# Patient Record
Sex: Male | Born: 1985 | Race: Black or African American | Hispanic: No | Smoking: Former smoker
Health system: Southern US, Community
[De-identification: ages and names within clinical notes are randomized; demographics above are authoritative.]

## PROBLEM LIST (undated history)

## (undated) DIAGNOSIS — J45909 Unspecified asthma, uncomplicated: Secondary | ICD-10-CM

---
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
## 2007-03-26 ENCOUNTER — Emergency Department (HOSPITAL_COMMUNITY): Admission: EM | Admit: 2007-03-26 | Discharge: 2007-03-26 | Payer: Self-pay | Admitting: Family Medicine

## 2008-07-21 IMAGING — CR DG ABDOMEN ACUTE W/ 1V CHEST
3 series · 3 of 3 positions shown · non-contrast
Comparison: none

CLINICAL DATA: Abdominal pain

[w chest pa]
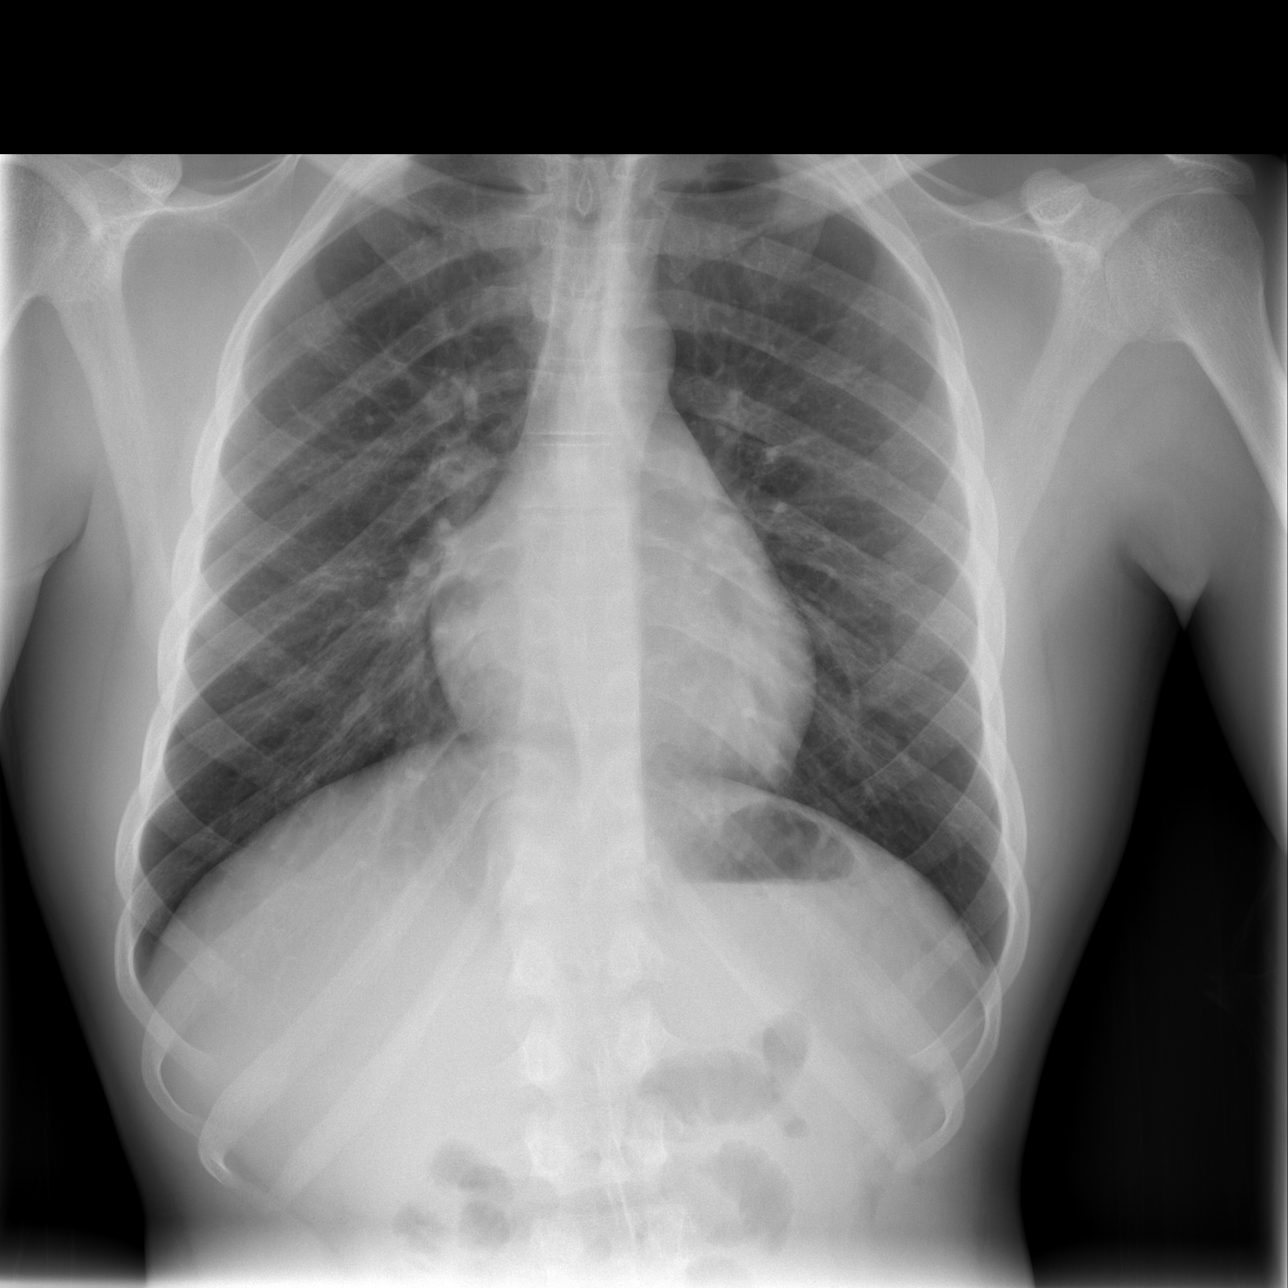

[w abdomen upright]
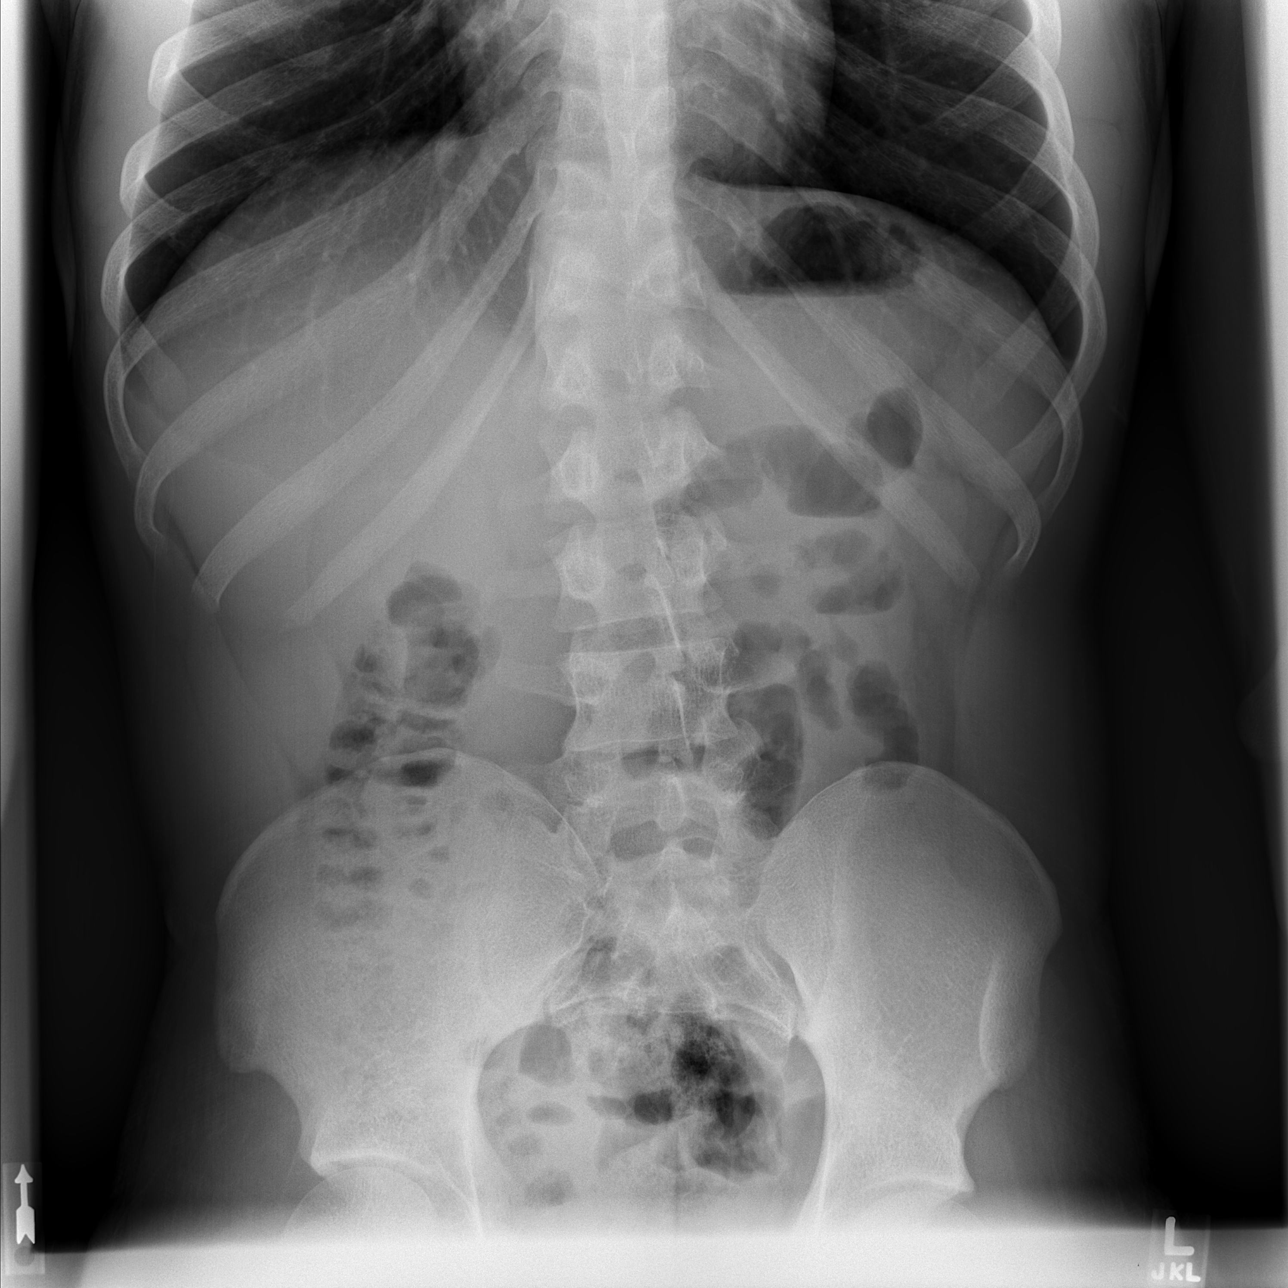

[t abdomen supine]
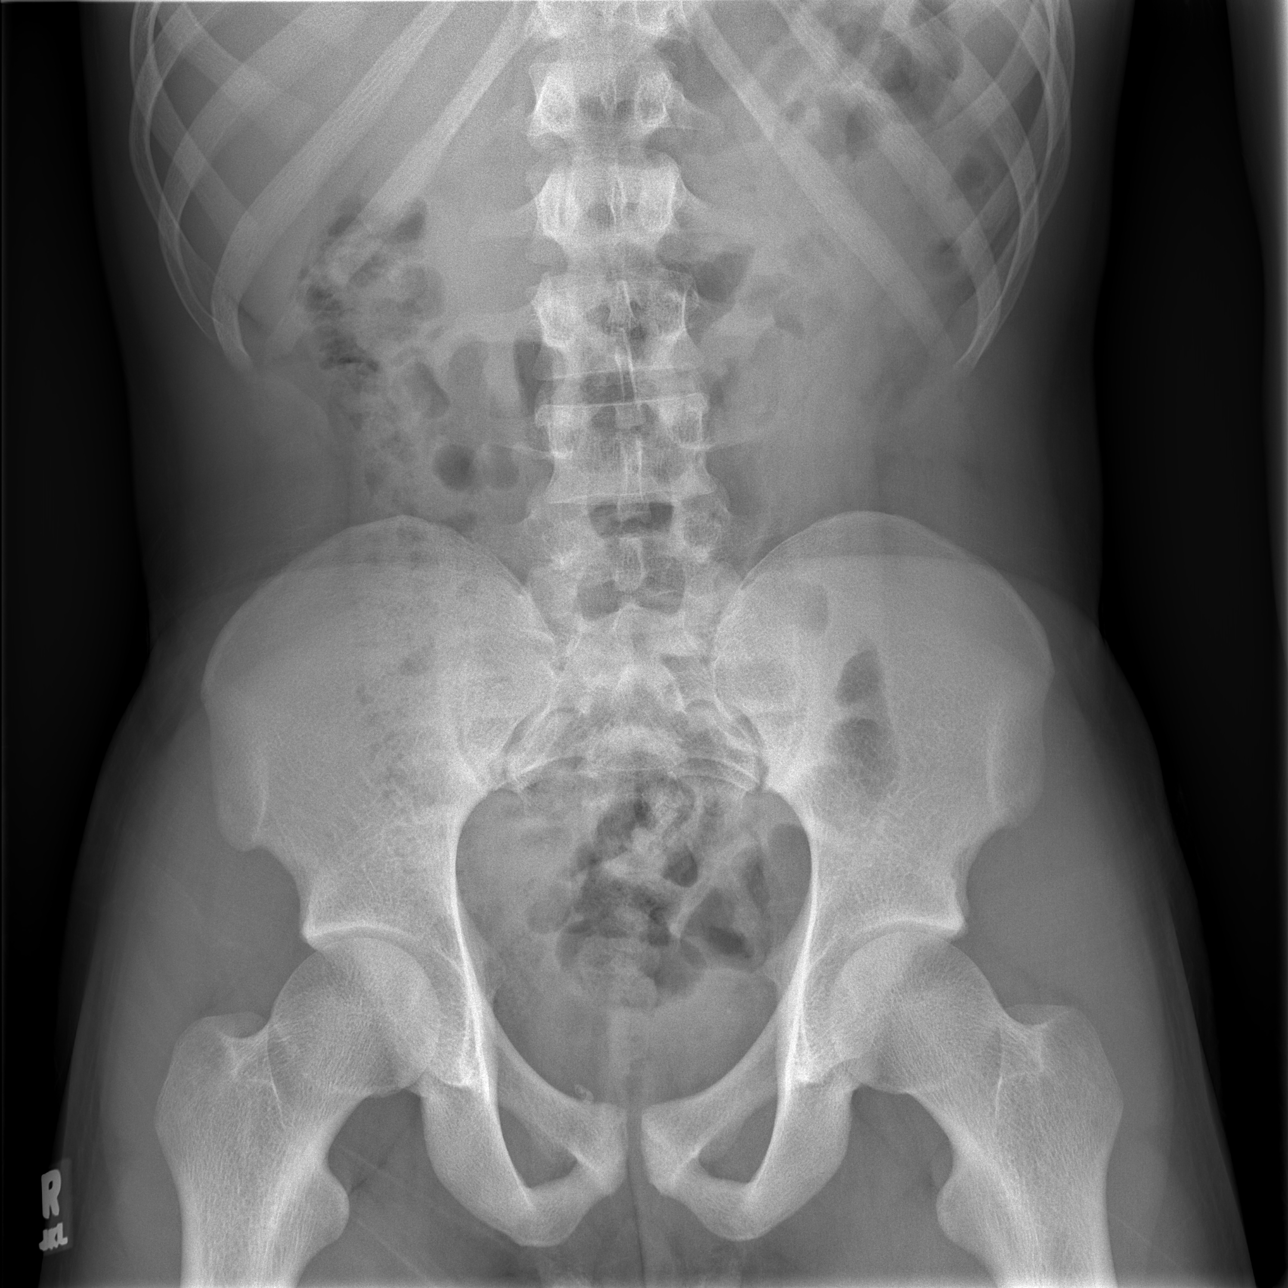

[3 of 3 positions shown; findings below may reference images not displayed]

Acute abdomen with chest:

No previous for comparison. The frontal chest film shows clear lungs. Heart size
and pulmonary vascularity normal.
Supine erect abdomen films show no free air. Small bowel decompressed. Normal
distribution of gas and stool throughout the colon. No abnormal abdominal
calcifications. Visualized bones unremarkable.
IMPRESSION: 1. Normal bowel gas pattern. No free air.
2. No acute cardiopulmonary disease.

## 2010-04-29 ENCOUNTER — Emergency Department (HOSPITAL_COMMUNITY): Admission: EM | Admit: 2010-04-29 | Discharge: 2010-04-29 | Payer: Self-pay | Admitting: Family Medicine

## 2010-05-29 ENCOUNTER — Emergency Department (HOSPITAL_COMMUNITY)
Admission: EM | Admit: 2010-05-29 | Discharge: 2010-05-29 | Payer: Self-pay | Source: Home / Self Care | Admitting: Emergency Medicine

## 2010-06-06 ENCOUNTER — Emergency Department (HOSPITAL_COMMUNITY)
Admission: EM | Admit: 2010-06-06 | Discharge: 2010-06-06 | Payer: Self-pay | Source: Home / Self Care | Admitting: Emergency Medicine

## 2010-08-15 ENCOUNTER — Emergency Department (HOSPITAL_COMMUNITY)
Admission: EM | Admit: 2010-08-15 | Discharge: 2010-08-15 | Disposition: A | Discharge status: Home / Self Care | Payer: Medicaid Other | Attending: Emergency Medicine | Admitting: Emergency Medicine

## 2010-08-15 DIAGNOSIS — K089 Disorder of teeth and supporting structures, unspecified: Secondary | ICD-10-CM | POA: Insufficient documentation

## 2010-08-15 DIAGNOSIS — K029 Dental caries, unspecified: Secondary | ICD-10-CM | POA: Insufficient documentation

## 2010-09-01 ENCOUNTER — Emergency Department (HOSPITAL_COMMUNITY)
Admission: EM | Admit: 2010-09-01 | Discharge: 2010-09-01 | Disposition: A | Discharge status: Home / Self Care | Payer: Medicaid Other | Attending: Emergency Medicine | Admitting: Emergency Medicine

## 2010-09-01 DIAGNOSIS — J351 Hypertrophy of tonsils: Secondary | ICD-10-CM | POA: Insufficient documentation

## 2010-09-01 DIAGNOSIS — J029 Acute pharyngitis, unspecified: Secondary | ICD-10-CM | POA: Insufficient documentation

## 2010-09-05 LAB — POCT RAPID STREP A (OFFICE): Streptococcus, Group A Screen (Direct): NEGATIVE

## 2010-09-05 LAB — POCT INFECTIOUS MONO SCREEN: Mono Screen: NEGATIVE

## 2011-04-10 LAB — URINALYSIS, ROUTINE W REFLEX MICROSCOPIC
Glucose, UA: NEGATIVE
Nitrite: NEGATIVE
Specific Gravity, Urine: 1.016
pH: 7

## 2011-04-10 LAB — DIFFERENTIAL
Basophils Absolute: 0
Lymphocytes Relative: 33
Lymphs Abs: 2
Neutro Abs: 3.4

## 2011-04-10 LAB — CBC
HCT: 47.7
Platelets: 206
RDW: 12.9
WBC: 6.2

## 2011-10-12 ENCOUNTER — Encounter (HOSPITAL_COMMUNITY): Payer: Self-pay

## 2011-10-12 ENCOUNTER — Emergency Department (HOSPITAL_COMMUNITY)
Admission: EM | Admit: 2011-10-12 | Discharge: 2011-10-12 | Disposition: A | Discharge status: Home / Self Care | Payer: Medicaid Other | Source: Home / Self Care

## 2011-10-12 DIAGNOSIS — H1010 Acute atopic conjunctivitis, unspecified eye: Secondary | ICD-10-CM

## 2011-10-12 DIAGNOSIS — J309 Allergic rhinitis, unspecified: Secondary | ICD-10-CM

## 2011-10-12 MED ORDER — KETOTIFEN FUMARATE 0.025 % OP SOLN: 1.0000 [drp] | Freq: Two times a day (BID) | OPHTHALMIC | Status: AC

## 2011-10-12 MED ORDER — POLYMYXIN B-TRIMETHOPRIM 10000-0.1 UNIT/ML-% OP SOLN: 1.0000 [drp] | OPHTHALMIC | Status: AC

## 2011-10-12 MED ORDER — CETIRIZINE HCL 10 MG PO TABS: 10.0000 mg | ORAL_TABLET | Freq: Every day | ORAL | Status: DC

## 2011-10-12 NOTE — Discharge Instructions (Signed)
Thank you for coming in today. I think you have allergies in your eye. Use the Zaditor eye drops up to twice a day as needed for itchy red eyes. Take Zyrtec daily. If you are  still feeling bad after one week start taking the Polytrim eyedrops as they are antibiotics.  Allergic Conjunctivitis The conjunctiva is a thin membrane that covers the visible white part of the eyeball and the underside of the eyelids. This membrane protects and lubricates the eye. The membrane has small blood vessels running through it that can normally be seen. When the conjunctiva becomes inflamed, the condition is called conjunctivitis. In response to the inflammation, the conjunctival blood vessels become swollen. The swelling results in redness in the normally white part of the eye. The blood vessels of this membrane also react when a person has allergies and is then called allergic conjunctivitis. This condition usually lasts for as long as the allergy persists. Allergic conjunctivitis cannot be passed to another person (non-contagious). The likelihood of bacterial infection is great and the cause is not likely due to allergies if the inflamed eye has:  A sticky discharge.   Discharge or sticking together of the lids in the morning.   Scaling or flaking of the eyelids where the eyelashes come out.   Red swollen eyelids.  CAUSES   Viruses.   Irritants such as foreign bodies.   Chemicals.   General allergic reactions.   Inflammation or serious diseases in the inside or the outside of the eye or the orbit (the boney cavity in which the eye sits) can cause a "red eye."  SYMPTOMS   Eye redness.   Tearing.   Itchy eyes.   Burning feeling in the eyes.   Clear drainage from the eye.   Allergic reaction due to pollens or ragweed sensitivity. Seasonal allergic conjunctivitis is frequent in the spring when pollens are in the air and in the fall.  DIAGNOSIS  This condition, in its many forms, is usually  diagnosed based on the history and an ophthalmological exam. It usually involves both eyes. If your eyes react at the same time every year, allergies may be the cause. While most "red eyes" are due to allergy or an infection, the role of an eye (ophthalmological) exam is important. The exam can rule out serious diseases of the eye or orbit. TREATMENT   Non-antibiotic eye drops, ointments, or medications by mouth may be prescribed if the ophthalmologist is sure the conjunctivitis is due to allergies alone.   Over-the-counter drops and ointments for allergic symptoms should be used only after other causes of conjunctivitis have been ruled out, or as your caregiver suggests.  Medications by mouth are often prescribed if other allergy-related symptoms are present. If the ophthalmologist is sure that the conjunctivitis is due to allergies alone, treatment is normally limited to drops or ointments to reduce itching and burning. HOME CARE INSTRUCTIONS   Wash hands before and after applying drops or ointments, or touching the inflamed eye(s) or eyelids.   Do not let the eye dropper tip or ointment tube touch the eyelid when putting medicine in your eye.   Stop using your soft contact lenses and throw them away. Use a new pair of lenses when recovery is complete. You should run through sterilizing cycles at least three times before use after complete recovery if the old soft contact lenses are to be used. Hard contact lenses should be stopped. They need to be thoroughly sterilized before use after  recovery.   Itching and burning eyes due to allergies is often relieved by using a cool cloth applied to closed eye(s).  SEEK MEDICAL CARE IF:   Your problems do not go away after two or three days of treatment.   Your lids are sticky (especially in the morning when you wake up) or stick together.   Discharge develops. Antibiotics may be needed either as drops, ointment, or by mouth.   You have extreme light  sensitivity.   An oral temperature above 102 F (38.9 C) develops.   Pain in or around the eye or any other visual symptom develops.  MAKE SURE YOU:   Understand these instructions.   Will watch your condition.   Will get help right away if you are not doing well or get worse.  Document Released: 09/01/2002 Document Revised: 05/31/2011 Document Reviewed: 07/28/2007 [facility] Patient Information 2012 [place], [place].

## 2011-10-12 NOTE — ED Provider Notes (Signed)
Charles Rollins is a 26 y.o. male who presents to Urgent Care today for bilateral conjunctivitis left worse than right for one week. Patient notes red itchy watery eyes associated with runny nose and sneezing. He denies any sick contacts and feels well otherwise. He has not tried any allergy medicine. He denies any trouble breathing. No eye pain or trouble seeing.   PMH reviewed. Otherwise healthy ROS as above otherwise neg.  no chest pains, palpitations, fevers, chills, abdominal pain nausea or vomiting. Medications reviewed. No current facility-administered medications for this encounter.   Current Outpatient Prescriptions  Medication Sig Dispense Refill  . cetirizine (ZYRTEC ALLERGY) 10 MG tablet Take 1 tablet (10 mg total) by mouth daily.  30 tablet  12  . ketotifen (ZADITOR) 0.025 % ophthalmic solution Place 1 drop into both eyes 2 (two) times daily.  5 mL  0  . trimethoprim-polymyxin b (POLYTRIM) ophthalmic solution Place 1 drop into both eyes every 4 (four) hours.  10 mL  0    Exam:  BP 125/78  Pulse 70  Temp(Src) 97.8 F (36.6 C) (Oral)  Resp 16  SpO2 100% Gen: Well NAD HEENT: EOMI,  MMM bilateral conjunctival injection without discharge. Lungs: CTABL Nl WOB   No results found for this or any previous visit (from the past 24 hour(s)). No results found.  Assessment and Plan: 26 y.o. male with likely allergic conjunctivitis.  Plan treat symptomatically with Zyrtec and Zaditor eyedrops.  Additionally we'll reserve Polytrim eyedrops for nonimprovement after one week. Provided a handout. Discussed warning signs of patient. He expresses understanding.     Rodolph Bong, MD 10/12/11 [phone]

## 2011-10-12 NOTE — ED Notes (Signed)
C/o eye reddened x 1 week, URI type syx

## 2011-10-13 NOTE — ED Provider Notes (Signed)
Medical screening examination/treatment/procedure(s) were performed by PGY-3 FM resident and as supervising physician I was immediately available for consultation/collaboration.   Haitham Dolinsky Moreno-Coll, MD   Lindberg Zenon Moreno-Coll, MD 10/13/11 0027 

## 2011-12-20 IMAGING — CR DG KNEE COMPLETE 4+V*L*
4 series · 4 of 4 positions shown · non-contrast
Comparison: None.

CLINICAL DATA: Fell - knee pain

LEFT KNEE - COMPLETE 4+ VIEW

[t knee ap left]
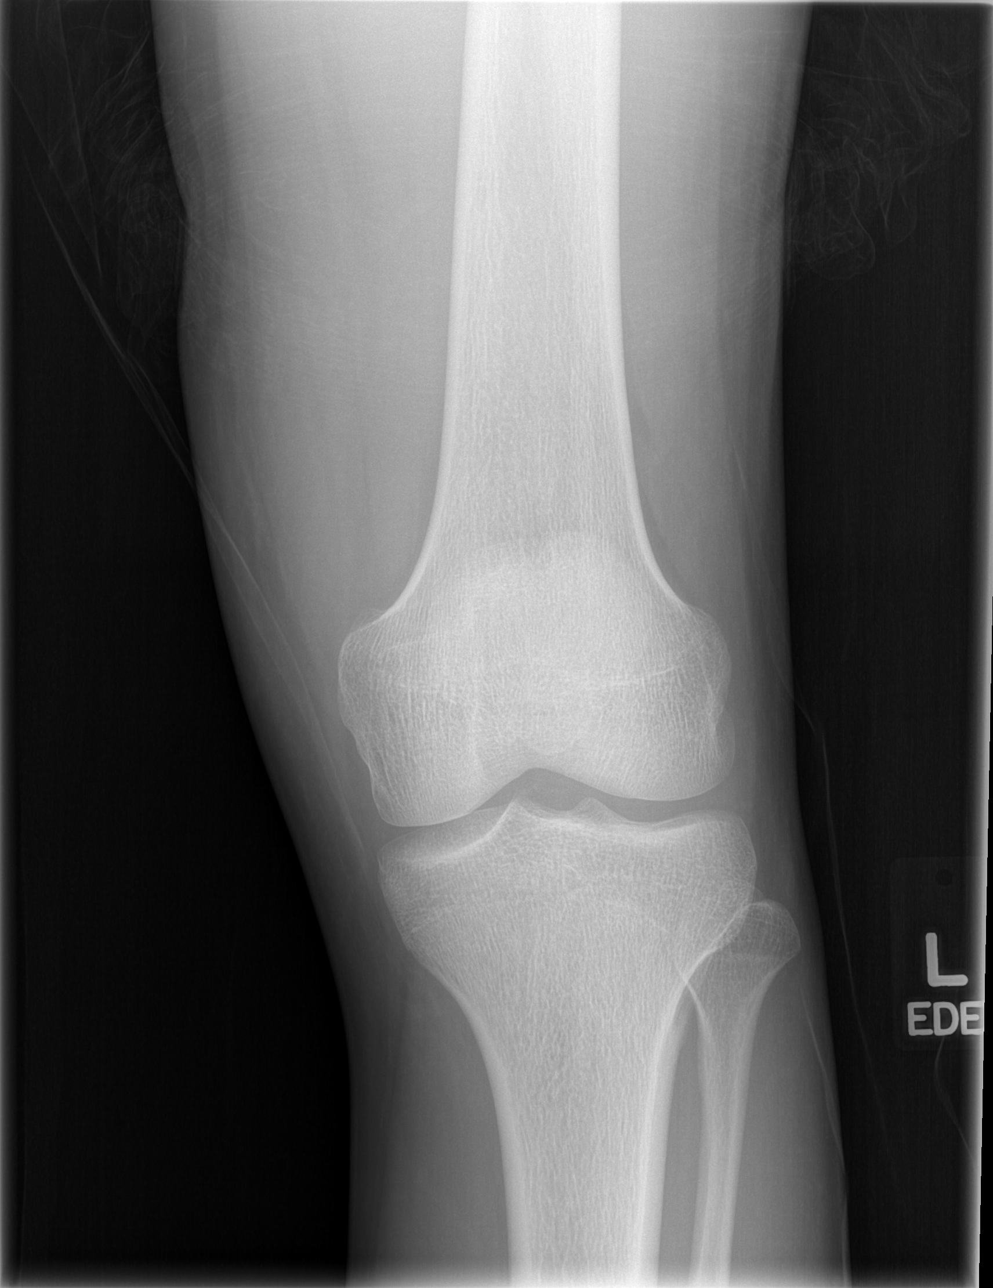

[t knee oblique left (1 of 2)]
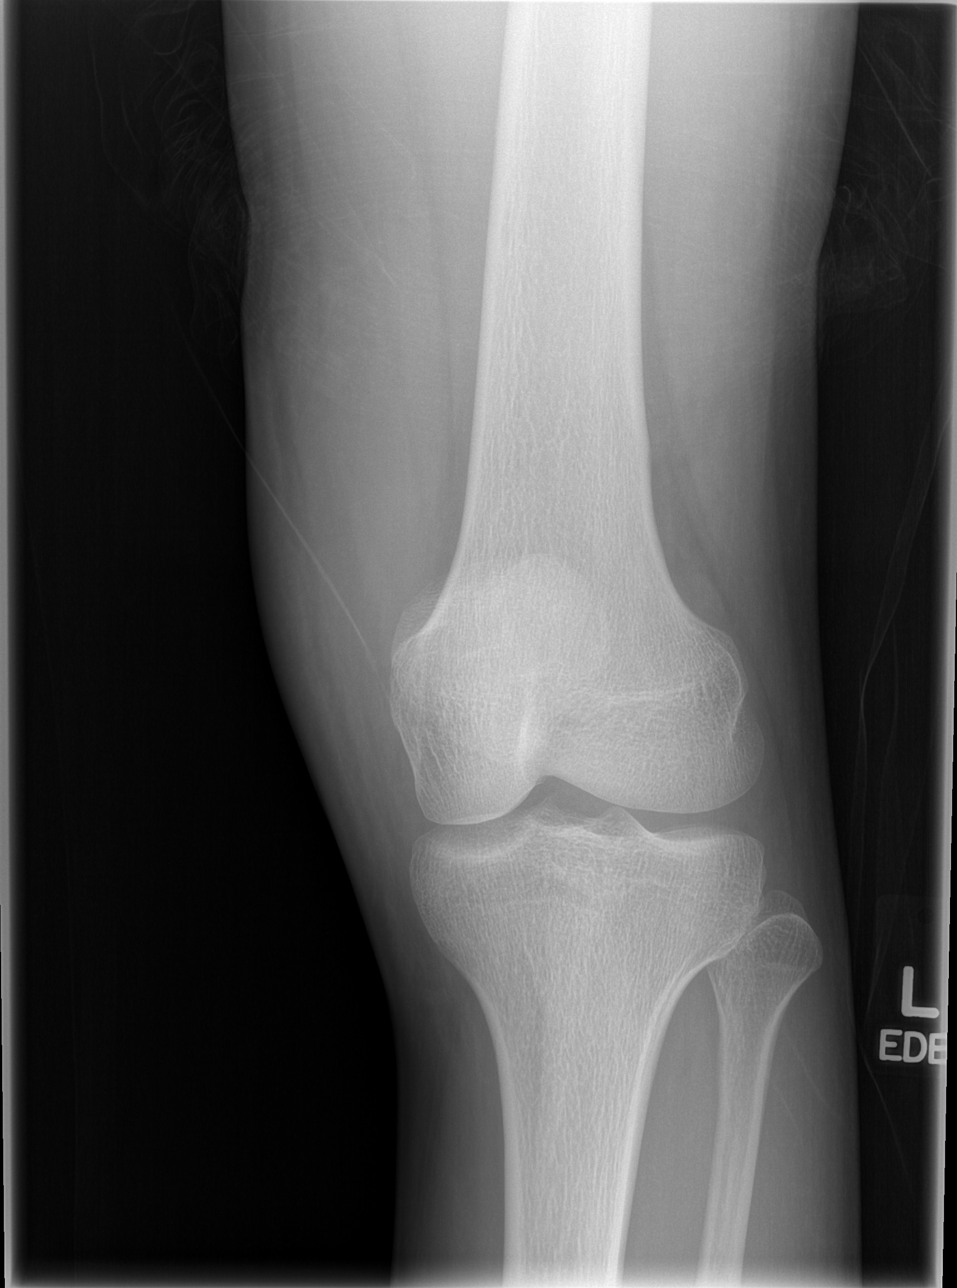

[t knee oblique left (2 of 2)]
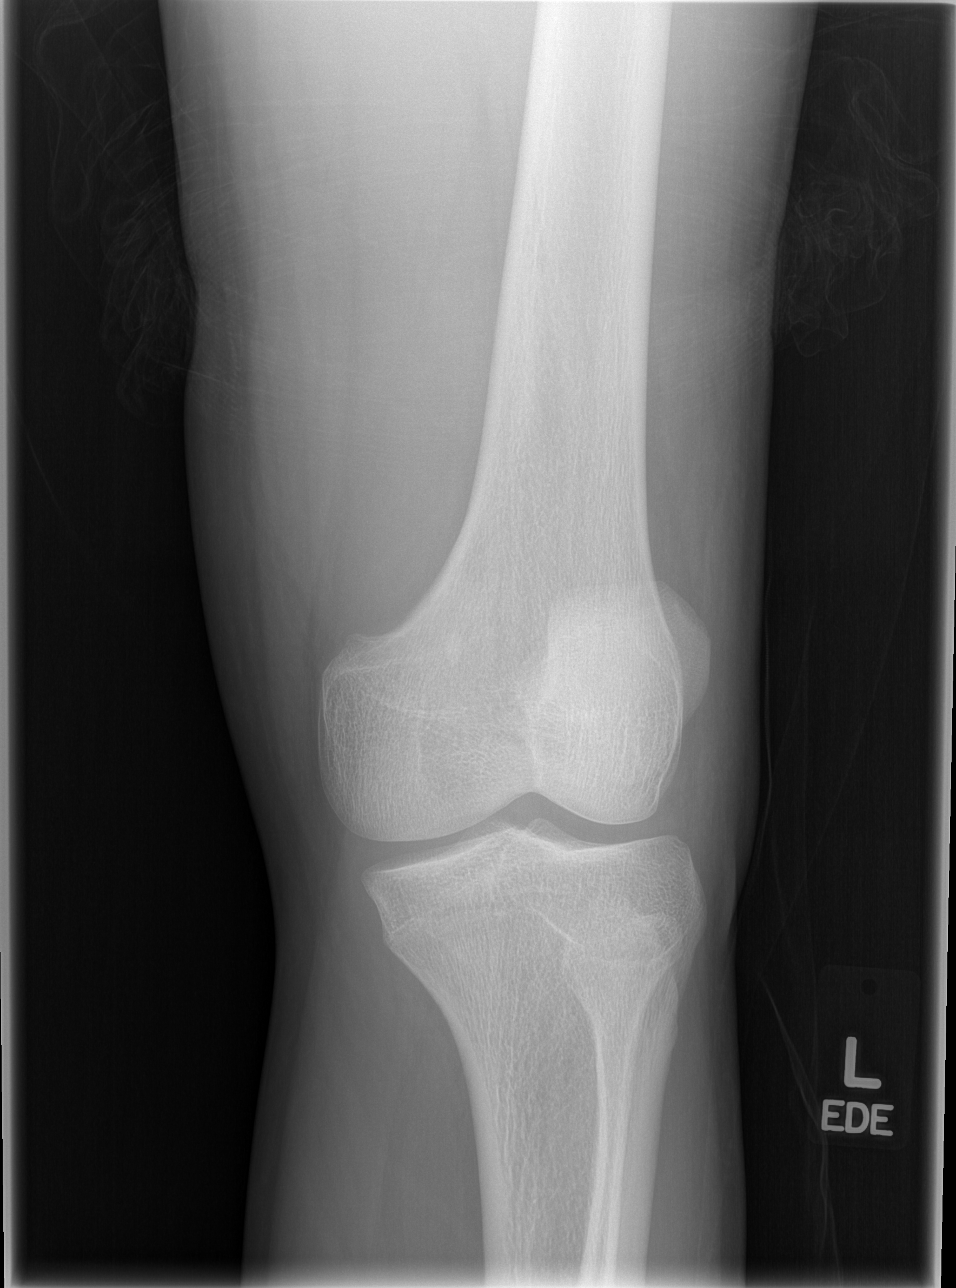

[t knee lat left]
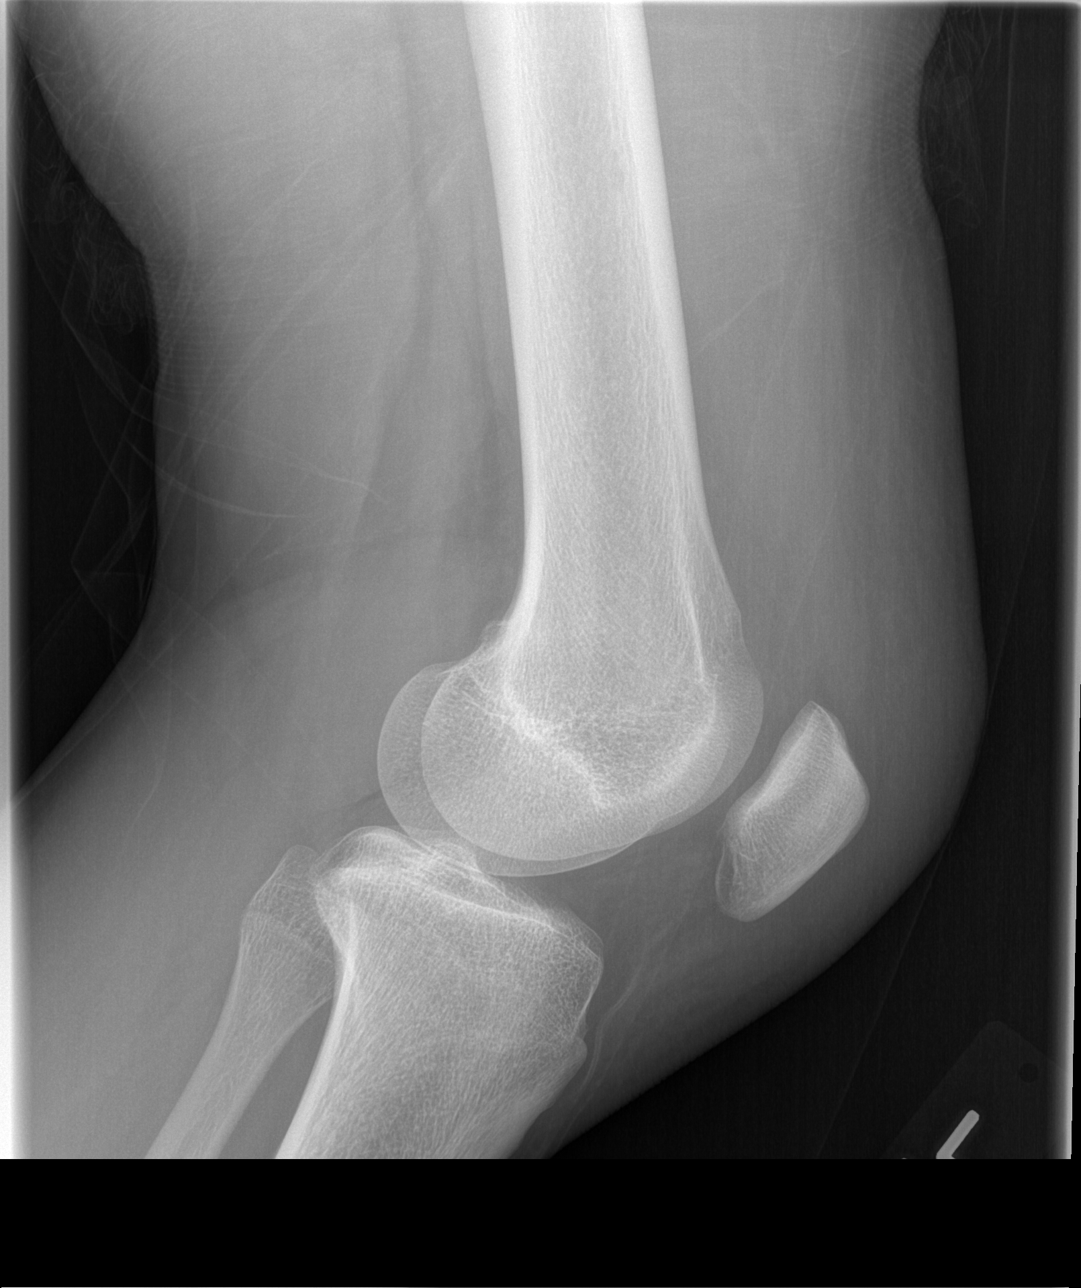

[4 of 4 positions shown; findings below may reference images not displayed]

FINDINGS: There is soft tissue swelling over the patella.  No joint
effusion or foreign body.  Bony structures intact.  The joint
normal.
IMPRESSION: Prepatellar soft tissue swelling - otherwise normal.

## 2013-11-10 ENCOUNTER — Emergency Department (HOSPITAL_COMMUNITY)
Admission: EM | Admit: 2013-11-10 | Discharge: 2013-11-10 | Disposition: A | Discharge status: Home / Self Care | Payer: Medicaid Other | Attending: Emergency Medicine | Admitting: Emergency Medicine

## 2013-11-10 ENCOUNTER — Encounter (HOSPITAL_COMMUNITY): Payer: Self-pay | Admitting: Emergency Medicine

## 2013-11-10 DIAGNOSIS — Z87891 Personal history of nicotine dependence: Secondary | ICD-10-CM | POA: Insufficient documentation

## 2013-11-10 DIAGNOSIS — K0889 Other specified disorders of teeth and supporting structures: Secondary | ICD-10-CM

## 2013-11-10 DIAGNOSIS — K089 Disorder of teeth and supporting structures, unspecified: Secondary | ICD-10-CM | POA: Insufficient documentation

## 2013-11-10 DIAGNOSIS — Z79899 Other long term (current) drug therapy: Secondary | ICD-10-CM | POA: Insufficient documentation

## 2013-11-10 MED ORDER — PENICILLIN V POTASSIUM 500 MG PO TABS: 500.0000 mg | ORAL_TABLET | Freq: Four times a day (QID) | ORAL | Status: AC

## 2013-11-10 MED ORDER — OXYCODONE-ACETAMINOPHEN 5-325 MG PO TABS: 1.0000 | ORAL_TABLET | ORAL | Status: DC | PRN

## 2013-11-10 NOTE — Discharge Instructions (Signed)
Dental Pain °A tooth ache may be caused by cavities (tooth decay). Cavities expose the nerve of the tooth to air and hot or cold temperatures. It may come from an infection or abscess (also called a boil or furuncle) around your tooth. It is also often caused by dental caries (tooth decay). This causes the pain you are having. °DIAGNOSIS  °Your caregiver can diagnose this problem by exam. °TREATMENT  °· If caused by an infection, it may be treated with medications which kill germs (antibiotics) and pain medications as prescribed by your caregiver. Take medications as directed. °· Only take over-the-counter or prescription medicines for pain, discomfort, or fever as directed by your caregiver. °· Whether the tooth ache today is caused by infection or dental disease, you should see your dentist as soon as possible for further care. °SEEK MEDICAL CARE IF: °The exam and treatment you received today has been provided on an emergency basis only. This is not a substitute for complete medical or dental care. If your problem worsens or new problems (symptoms) appear, and you are unable to meet with your dentist, call or return to this location. °SEEK IMMEDIATE MEDICAL CARE IF:  °· You have a fever. °· You develop redness and swelling of your face, jaw, or neck. °· You are unable to open your mouth. °· You have severe pain uncontrolled by pain medicine. °MAKE SURE YOU:  °· Understand these instructions. °· Will watch your condition. °· Will get help right away if you are not doing well or get worse. °Document Released: 06/11/2005 Document Revised: 09/03/2011 Document Reviewed: 01/28/2008 °ExitCare® Patient Information ©2014 [employer]. ° °Dental Pain °A tooth ache may be caused by cavities (tooth decay). Cavities expose the nerve of the tooth to air and hot or cold temperatures. It may come from an infection or abscess (also called a boil or furuncle) around your tooth. It is also often caused by dental caries (tooth  decay). This causes the pain you are having. °DIAGNOSIS  °Your caregiver can diagnose this problem by exam. °TREATMENT  °· If caused by an infection, it may be treated with medications which kill germs (antibiotics) and pain medications as prescribed by your caregiver. Take medications as directed. °· Only take over-the-counter or prescription medicines for pain, discomfort, or fever as directed by your caregiver. °· Whether the tooth ache today is caused by infection or dental disease, you should see your dentist as soon as possible for further care. °SEEK MEDICAL CARE IF: °The exam and treatment you received today has been provided on an emergency basis only. This is not a substitute for complete medical or dental care. If your problem worsens or new problems (symptoms) appear, and you are unable to meet with your dentist, call or return to this location. °SEEK IMMEDIATE MEDICAL CARE IF:  °· You have a fever. °· You develop redness and swelling of your face, jaw, or neck. °· You are unable to open your mouth. °· You have severe pain uncontrolled by pain medicine. °MAKE SURE YOU:  °· Understand these instructions. °· Will watch your condition. °· Will get help right away if you are not doing well or get worse. °Document Released: 06/11/2005 Document Revised: 09/03/2011 Document Reviewed: 01/28/2008 °ExitCare® Patient Information ©2014 [employer]. ° °Emergency Department Resource Guide °1) Find a Doctor and Pay Out of Pocket °Although you won't have to find out who is covered by your insurance plan, it is a good idea to ask around and get recommendations. You   will then need to call the office and see if the doctor you have chosen will accept you as a new patient and what types of options they offer for patients who are self-pay. Some doctors offer discounts or will set up payment plans for their patients who do not have insurance, but you will need to ask so you aren't surprised when you get to your  appointment. ° °2) Contact Your Local Health Department °Not all health departments have doctors that can see patients for sick visits, but many do, so it is worth a call to see if yours does. If you don't know where your local health department is, you can check in your phone book. The CDC also has a tool to help you locate your state's health department, and many state websites also have listings of all of their local health departments. ° °3) Find a Walk-in Clinic °If your illness is not likely to be very severe or complicated, you may want to try a walk in clinic. These are popping up all over the country in pharmacies, drugstores, and shopping centers. They're usually staffed by nurse practitioners or physician assistants that have been trained to treat common illnesses and complaints. They're usually fairly quick and inexpensive. However, if you have serious medical issues or chronic medical problems, these are probably not your best option. ° °No Primary Care Doctor: °- Call [facility] at  832-8000 - they can help you locate a primary care doctor that  accepts your insurance, provides certain services, etc. °- Physician Referral Service- 1-800-533-3463 ° °Chronic Pain Problems: °Organization         Address  Phone   Notes  °[facility]  (336) 297-2271 Patients need to be referred by their primary care doctor.  ° °Medication Assistance: °Organization         Address  Phone   Notes  °[place] [facility] [address] °[place], [place] [address] (336) 641-8030 --Must be a resident of [place] °-- Must have NO insurance coverage whatsoever (no Medicaid/ Medicare, etc.) °-- The pt. MUST have a primary care doctor that directs their care regularly and follows them in the community °  °[facility]  (866) 331-1348   °[employer]  (888) 892-1162   ° °Agencies that provide inexpensive medical care: °Organization         Address  Phone   Notes  °[facility]  (336) 832-8035   °[facility]    (336) 832-7272   °[facility] [address] °[place], [place] [address] (336) 832-4777   °[facility] [address], °[place] (336) 271-4999   °[facility]    (336) 373-0678   °[facility]    (336) 272-1050   °[facility] ° [address], [place] Phone:  (336) 832-4444, Fax:  (336) 832-4440 Hours of Operation:  9 am - 6 pm, M-F.  Also accepts Medicaid/Medicare and self-pay.  °[facility] ° [address], [place] Phone: (336) 832-3150, Fax: (336) [phone]. Hours of Operation:  8:30 am - 5:30 pm, M-F.  Also accepts Medicaid and self-pay.  °[facility] [place] [address], [place] Phone: (336) 878-6027   °[facility] [address], [place],  (336)723-1848, Ext. 123 Mondays & Thursdays: 7-9 AM.  First 15 patients are seen on a first come, first   serve basis. °  ° °Medicaid-accepting [place] Providers: ° °Organization         Address  Phone   Notes  °[facility] [address], [place] (336) 641-2100 Also accepts self-pay patients.  °[facility] [address], [place] ° (336) 856-9996   °[facility] [address], [place] (336) 288-8857   °[facility] [address] [place] [address], [place] (336) 299-7000   °Veita Bland [address], [place]  ° (336) [phone] Only accepts [place] Access Medicaid patients after they have their name applied to their card.  ° °Self-Pay (no insurance) in [place]: ° °Organization         Address  Phone   Notes  °Sickle Cell Patients, [facility] [address], [place] (336) 832-1970   °[facility] [address], [place] (336) 832-4400   °[facility]  [place] ° [address], [place] (336) 992-4800   °[facility]/Dr. Osei-Bonsu ° [address] [place] [address], [place] or [address], [place] (336) [phone] Phone number for both [place] and [place] locations is the same.  °[facility] [address], [place] (336) 299-0000   °[facility] [address] [place] [address], [place] or [address] (336) 852-7530 °(336) 878-2260   °[facility] [address], [place] (336) 350-1642, phone; (336) 294-5005, fax Sees patients 1st and 3rd Saturday of every month.  Must not qualify for public or private insurance (i.e. Medicaid, Medicare, [place] Health Choice, Veterans' Benefits) • Household income should be no more than 200% of the poverty level •The clinic cannot treat you if you are pregnant or think you are pregnant • Sexually transmitted diseases are not treated at the clinic.  ° ° °Dental Care: °Organization         Address  Phone  Notes  °[facility] [address], [place] (336) 641-6152 Accepts children up to age 21 who are enrolled in Medicaid or [place] Health Choice; pregnant women with a Medicaid card; and children who have applied for Medicaid or [place] Health Choice, but were declined, whose parents can pay a reduced fee at time of service.  °[facility] [place]  [address], [place] (336) 641-7733 Accepts children up to age 21 who are enrolled in Medicaid or [place] Health Choice; pregnant women with a Medicaid card; and children who have applied for Medicaid or [place] Health Choice, but were declined, whose parents can pay a reduced fee at time of service.  °[facility] ° [address], [place] (336) 641-4533 Patients are seen by appointment only. Walk-ins are not accepted. [facility] will see patients 18 years of age and  older. °Monday - Tuesday (8am-5pm) °Most Wednesdays (8:30-5pm) °$30 per visit, cash only  °[facility] ° [address], [place] (336) 641-4533 Patients are seen by appointment only. Walk-ins are not accepted. [facility] will see patients 18 years of age and older. °One Wednesday Evening (Monthly: Volunteer Based).  $30 per visit, cash only  °[facility]  (919) 537-3737 for adults; Children under age 4, call Graduate Pediatric Dentistry at ([phone]) [phone]. Children aged 4-14, please call (  [phone]) [phone] to request a pediatric application. ° Dental services are provided in all areas of dental care including fillings, crowns and bridges, complete and partial dentures, implants, gum treatment, root canals, and extractions. Preventive care is also provided. Treatment is provided to both adults and children. °Patients are selected via a lottery and there is often a waiting list. °  °[facility] [address], °[place] ° (336) 763-8833 [email or web address] °  °[facility] [address], [place], [place] (336)723-1848, Ext. 123 Second and Fourth Thursday of each month, opens at 6:30 AM; Clinic ends at 9 AM.  Patients are seen on a first-come first-served basis, and a limited number are seen during each clinic.  ° °[facility] ° [address], [place], [place] (336) 723-7904   Eligibility Requirements °You must have lived in [place], [place], or [place] counties for at least the last three months. °  You cannot be eligible for state or federal sponsored healthcare insurance, including Veterans Administration, Medicaid, or Medicare. °  You generally cannot be eligible for healthcare insurance through your employer.  °  How to apply: °Eligibility screenings are held every Tuesday and Wednesday afternoon from 1:00 pm until 4:00 pm. You do not need an appointment for the interview!  °[facility] [address],  [place], [place] 336-631-2330   °[facility]  336-342-8273   °[facility]  336-703-3100   °[facility]  336-570-6415   ° °Behavioral Health Resources in the Community: °Intensive Outpatient Programs °Organization         Address  Phone  Notes  °[facility] [address], [place], [place] 336-878-6098   °[facility] [address], [place], [place] 336-832-9800   °[facility] [address], [place], [place] ° 336-882-2125   °[facility] [address],  °[place], [place] 1-800-853-5163 or 336-641-4981   °Substance Abuse Resources °Organization         Address  Phone  Notes  °[facility]  336-882-2125   °[facility]  336-784-9470   °[facility]  336-285-9073   °[facility]  336-845-3988   °Residential & Outpatient Substance Abuse Program  1-800-659-3381   °Psychological Services °Organization         Address  Phone  Notes  °[facility]  336- 832-9600   °[facility]  336- 378-7881   °[facility] [address], [place] 1-800-853-5163 or 336-641-4981   ° °Mobile Crisis Teams °Organization         Address  Phone  Notes  °[facility], Mobile Crisis Care Unit  1-877-626-1772   °Assertive °[facility] ° [address] [place], [place] 336-834-9664   °Sharon DeEsch [address] °[place] 336-554-5454   ° °Self-Help/Support Groups °Organization         Address  Phone             Notes  °[facility] [place] - variety of support groups  336- 373-1402 Call for more information  °Narcotics Anonymous (NA), [facility] [address], °[place]  2 meetings at this location  ° °Residential Treatment Programs °Organization         Address  Phone  Notes  °[facility] [address],    °[place]  1-866-801-8205   °[facility] °   [address], [place], [place] 704-293-8524   °[facility] [address], [place] 336-845-3988 Admissions: 8am-3pm M-F  °[facility] [address]    °[place], [place] 336-841-1104   °[facility] [address], [place], [place] 336-379-7146   °[facility] [address]  °[place], [place] 336-285-9073   °[facility] Intensive Outpatient [address], [place], [place] 336-852-3033   °[facility] [address]  °[place], [place] 1-877-615-2722 or 336-784-9470   °[facility] [address] [place], [place] 336-227-7417 Accepts Medicaid  °[facility] [address]  °[place] 1-800-659-3381 Substance Abuse/Addiction Treatment  ° °[place] Behavioral Health Resources °Organization         Address  Phone  Notes  °[facility]  (888) 581-9988   °Julie Brannon, PhD [address] [place], [place]   (336) 349-5553 or (336) 951-0000   °[facility]   [address] °[place], [place] (336) 349-4454   °[facility] [address], [place], [place] (336) 342-8316 Insurance/Medicaid/sponsorship through [facility]  °[facility] [address]                                    [place], [place] (336) 342-8316 Therapy/tele-psych/case  °[facility] [address]  ° [place], [place] (336) 349-2233    °Dr. Arfeen  (336) 349-4544   °[facility] [place]  [employer] [place] [facility] 1) [address],  °2) [address], [place] °3)  [address], [place] (336) 349-3220 °(336) 342-7768 ° °(336) 342-8140   °[place] Child Abuse [facility] (336) 342-1394 or (336) 342-3537 (After Hours)    ° ° °

## 2013-11-10 NOTE — ED Provider Notes (Signed)
CSN: [id number]     Arrival date & time 11/10/13  0708 History   [id number] MD Initiated Contact with Patient 11/10/13 [id number]     Chief Complaint  Patient presents with  . Dental Pain     (Consider location/radiation/quality/duration/timing/severity/associated sxs/prior Treatment) HPI  [name] with L facial pain. Onset yesterday and progressively worsening. Pain L lower molar. No fever or chills. No difficulty breathing or swallowing. No neck pain. No change in voice. Has been taking ibuprofen and tylenol with some relief.   History reviewed. No pertinent past medical history. History reviewed. No pertinent past surgical history. History reviewed. No pertinent family history. History  Substance Use Topics  . Smoking status: Former Smoker    Types: Cigarettes  . Smokeless tobacco: Never Used  . Alcohol Use: No    Review of Systems  All systems reviewed and negative, other than as noted in HPI.   Allergies  Review of patient's allergies indicates no known allergies.  Home Medications   Prior to Admission medications   Medication Sig Start Date End Date Taking? Authorizing Provider  ibuprofen (ADVIL,MOTRIN) 200 MG tablet Take 800 mg by mouth every 6 (six) hours as needed for mild pain.   Yes Historical Provider, MD  oxyCODONE-acetaminophen (PERCOCET/ROXICET) 5-325 MG per tablet Take 1-2 tablets by mouth every 4 (four) hours as needed for severe pain. 11/10/13   Raeford RazorStephen Kirklin Mcduffee, MD  penicillin v potassium (VEETID) 500 MG tablet Take 1 tablet (500 mg total) by mouth 4 (four) times daily. 11/10/13 11/17/13  Raeford RazorStephen Embry Huss, MD   BP 140/83  Pulse 68  Temp(Src) 97.1 F (36.2 C) (Oral)  Resp 18  SpO2 99% Physical Exam  Nursing note and vitals reviewed. Constitutional: He appears well-developed and well-nourished. No distress.  HENT:  Head: Normocephalic and atraumatic.  L lower first molar broken at gumline. Mild L facial fullness/tenderness. Submental tissues soft. Posterior pharynx clear.  Handling secretions. Normal sounding phonation.   Eyes: Conjunctivae are normal. Right eye exhibits no discharge. Left eye exhibits no discharge.  Neck: Neck supple.  Cardiovascular: Normal rate, regular rhythm and normal heart sounds.  Exam reveals no gallop and no friction rub.   No murmur heard. Pulmonary/Chest: Effort normal and breath sounds normal. No respiratory distress.  Abdominal: Soft. He exhibits no distension. There is no tenderness.  Musculoskeletal: He exhibits no edema and no tenderness.  Neurological: He is alert.  Skin: Skin is warm and dry.  Psychiatric: He has a normal mood and affect. His behavior is normal. Thought content normal.    ED Course  Procedures (including critical care time) Labs Review Labs Reviewed - No data to display  Imaging Review No results found.   EKG Interpretation None      MDM   Final diagnoses:  Pain, dental    28 year old male with dental/facial pain. Left lower first molar broken at the gumline. There is some left facial fullness/tenderness. No evidence of deep space infection. Antibiotics. As needed pain medication. Dental resources provided at discharge.    Raeford RazorStephen Kymani Shimabukuro, MD 11/10/13 [id number]

## 2013-11-10 NOTE — ED Notes (Signed)
Pt from home with c/o dental pain since Sunday.  Pt states he has a tooth that needs to be removed. Pt in NAD, A&O.

## 2013-12-21 ENCOUNTER — Encounter (HOSPITAL_COMMUNITY): Payer: Self-pay | Admitting: Emergency Medicine

## 2013-12-21 ENCOUNTER — Emergency Department (INDEPENDENT_AMBULATORY_CARE_PROVIDER_SITE_OTHER)
Admission: EM | Admit: 2013-12-21 | Discharge: 2013-12-21 | Disposition: A | Discharge status: Home / Self Care | Payer: Self-pay | Source: Home / Self Care

## 2013-12-21 DIAGNOSIS — K089 Disorder of teeth and supporting structures, unspecified: Secondary | ICD-10-CM

## 2013-12-21 DIAGNOSIS — K0500 Acute gingivitis, plaque induced: Secondary | ICD-10-CM

## 2013-12-21 DIAGNOSIS — K08403 Partial loss of teeth, unspecified cause, class III: Secondary | ICD-10-CM

## 2013-12-21 DIAGNOSIS — K0889 Other specified disorders of teeth and supporting structures: Secondary | ICD-10-CM

## 2013-12-21 DIAGNOSIS — IMO0001 Reserved for inherently not codable concepts without codable children: Secondary | ICD-10-CM

## 2013-12-21 DIAGNOSIS — K08139 Complete loss of teeth due to caries, unspecified class: Secondary | ICD-10-CM

## 2013-12-21 MED ORDER — HYDROCODONE-ACETAMINOPHEN 5-325 MG PO TABS: 1.0000 | ORAL_TABLET | ORAL | Status: DC | PRN

## 2013-12-21 MED ORDER — AMOXICILLIN 500 MG PO CAPS: 1000.0000 mg | ORAL_CAPSULE | Freq: Two times a day (BID) | ORAL | Status: DC

## 2013-12-21 NOTE — ED Provider Notes (Signed)
CSN: [id number]     Arrival date & time 12/21/13  0932 History   First MD Initiated Contact with Patient 12/21/13 1000     Chief Complaint  Patient presents with  . Dental Pain   (Consider location/radiation/quality/duration/timing/severity/associated sxs/prior Treatment) HPI Comments: Toothache for 1-2 days. 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 St R lower tooth is painful with swelling of gum. Was seen in ED 1 mo ago for another tooth and given resources for dentist   History reviewed. No pertinent past medical history. History reviewed. No pertinent past surgical history. History reviewed. No pertinent family history. History  Substance Use Topics  . Smoking status: Former Smoker    Types: Cigarettes  . Smokeless tobacco: Never Used  . Alcohol Use: No    Review of Systems  HENT: Positive for dental problem. Negative for ear pain.        Toothache as above  All other systems reviewed and are negative.   Allergies  Review of patient's allergies indicates no known allergies.  Home Medications   Prior to Admission medications   Medication Sig Start Date End Date Taking? Authorizing Chantelle Verdi  amoxicillin (AMOXIL) 500 MG capsule Take 2 capsules (1,000 mg total) by mouth 2 (two) times daily. 12/21/13   Hayden Rasmussenavid Mabe, NP  HYDROcodone-acetaminophen (NORCO/VICODIN) 5-325 MG per tablet Take 1 tablet by mouth every 4 (four) hours as needed. 12/21/13   Hayden Rasmussenavid Mabe, NP  ibuprofen (ADVIL,MOTRIN) 200 MG tablet Take 800 mg by mouth every 6 (six) hours as needed for mild pain.    Historical Chante Mayson, MD  oxyCODONE-acetaminophen (PERCOCET/ROXICET) 5-325 MG per tablet Take 1-2 tablets by mouth every 4 (four) hours as needed for severe pain. 11/10/13   Raeford RazorStephen Kohut, MD   BP 152/103  Pulse 64  Temp(Src) 98.4 F (36.9 C) (Oral)  Resp 16  SpO2 100% Physical Exam  Nursing note and vitals reviewed. Constitutional: He is oriented to person, place, and time. He appears  well-developed and well-nourished. No distress.  HENT:  Mouth/Throat: Oropharynx is clear and moist.  Poor dentition. Multiple cavernous teeth. R first molar is a crater with surrounding gingival redness, pain and tenderness. Mild R facial swelling opposite the tooth.  Neck: Normal range of motion. Neck supple.  Lymphadenopathy:    He has no cervical adenopathy.  Neurological: He is alert and oriented to person, place, and time.  Skin: Skin is warm and dry.    ED Course  Procedures (including critical care time) Labs Review Labs Reviewed - No data to display  Imaging Review No results found.   MDM   1. Toothache   2. 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 Acute gingivitis     norco 5 mg #15 Amoxicillin Must see dentinst' Financial assist provided    Hayden RasmussenDavid Mabe, NP 12/21/13 1056

## 2013-12-21 NOTE — ED Notes (Signed)
C/o right side dental pain since Sunday States gums is swollen Has tried pain meds as tx  No dentist appt scheduled

## 2013-12-21 NOTE — Discharge Instructions (Signed)
Dental Pain °A tooth ache may be caused by cavities (tooth decay). Cavities expose the nerve of the tooth to air and hot or cold temperatures. It may come from an infection or abscess (also called a boil or furuncle) around your tooth. It is also often caused by dental caries (tooth decay). This causes the pain you are having. °DIAGNOSIS  °Your caregiver can diagnose this problem by exam. °TREATMENT  °· If caused by an infection, it may be treated with medications which kill germs (antibiotics) and pain medications as prescribed by your caregiver. Take medications as directed. °· Only take over-the-counter or prescription medicines for pain, discomfort, or fever as directed by your caregiver. °· Whether the tooth ache today is caused by infection or dental disease, you should see your dentist as soon as possible for further care. °SEEK MEDICAL CARE IF: °The exam and treatment you received today has been provided on an emergency basis only. This is not a substitute for complete medical or dental care. If your problem worsens or new problems (symptoms) appear, and you are unable to meet with your dentist, call or return to this location. °SEEK IMMEDIATE MEDICAL CARE IF:  °· You have a fever. °· You develop redness and swelling of your face, jaw, or neck. °· You are unable to open your mouth. °· You have severe pain uncontrolled by pain medicine. °MAKE SURE YOU:  °· Understand these instructions. °· Will watch your condition. °· Will get help right away if you are not doing well or get worse. °Document Released: 06/11/2005 Document Revised: 09/03/2011 Document Reviewed: 01/28/2008 °ExitCare® Patient Information ©2015 [employer]. This information is not intended to replace advice given to you by your health care provider. Make sure you discuss any questions you have with your health care provider. ° °Dental Care and Dentist Visits °Dental care supports good overall health. Regular dental visits can also help you  avoid dental pain, bleeding, infection, and other more serious health problems in the future. It is important to keep the mouth healthy because diseases in the teeth, gums, and other oral tissues can spread to other areas of the body. Some problems, such as diabetes, heart disease, and pre-term labor have been associated with poor oral health.  °See your dentist every 6 months. If you experience emergency problems such as a toothache or broken tooth, go to the dentist right away. If you see your dentist regularly, you may catch problems early. It is easier to be treated for problems in the early stages.  °WHAT TO EXPECT AT A DENTIST VISIT  °Your dentist will look for many common oral health problems and recommend proper treatment. At your regular dental visit, you can expect: °· Gentle cleaning of the teeth and gums. This includes scraping and polishing. This helps to remove the sticky substance around the teeth and gums (plaque). Plaque forms in the mouth shortly after eating. Over time, plaque hardens on the teeth as tartar. If tartar is not removed regularly, it can cause problems. Cleaning also helps remove stains. °· Periodic X-rays. These pictures of the teeth and supporting bone will help your dentist assess the health of your teeth. °· Periodic fluoride treatments. Fluoride is a natural mineral shown to help strengthen teeth. Fluoride treatment involves applying a fluoride gel or varnish to the teeth. It is most commonly done in children. °· Examination of the mouth, tongue, jaws, teeth, and gums to look for any oral health problems, such as: °¨ Cavities (dental caries). This is   decay on the tooth caused by plaque, sugar, and acid in the mouth. It is best to catch a cavity when it is small.  Inflammation of the gums caused by plaque buildup (gingivitis).  Problems with the mouth or malformed or misaligned teeth.  Oral cancer or other diseases of the soft tissues or jaws. KEEP YOUR TEETH AND GUMS  HEALTHY For healthy teeth and gums, follow these general guidelines as well as your dentist's specific advice:  Have your teeth professionally cleaned at the dentist every 6 months.  Brush twice daily with a fluoride toothpaste.  Floss your teeth daily.  Ask your dentist if you need fluoride supplements, treatments, or fluoride toothpaste.  Eat a healthy diet. Reduce foods and drinks with added sugar.  Avoid smoking. TREATMENT FOR ORAL HEALTH PROBLEMS If you have oral health problems, treatment varies depending on the conditions present in your teeth and gums.  Your caregiver will most likely recommend good oral hygiene at each visit.  For cavities, gingivitis, or other oral health disease, your caregiver will perform a procedure to treat the problem. This is typically done at a separate appointment. Sometimes your caregiver will refer you to another dental specialist for specific tooth problems or for surgery. SEEK IMMEDIATE DENTAL CARE IF:  You have pain, bleeding, or soreness in the gum, tooth, jaw, or mouth area.  A permanent tooth becomes loose or separated from the gum socket.  You experience a blow or injury to the mouth or jaw area. Document Released: 02/21/2011 Document Revised: 09/03/2011 Document Reviewed: 02/21/2011 [facility] Patient Information 2015 [place], [place]. This information is not intended to replace advice given to you by your health care provider. Make sure you discuss any questions you have with your health care provider.  Gingivitis Gingivitis is a form of gum (periodontal) disease that causes redness, soreness, and swelling (inflammation) of your gums. CAUSES The most common cause of gingivitis is poor oral hygiene. A sticky substance made of bacteria, mucus, and food particles (plaque), is deposited on the exposed part of teeth. As plaque builds up, it reacts with the saliva in your mouth to form something called  tartar. Tartar is a hard deposit that  becomes trapped around the base of the tooth. Plaque and tartar irritate the gums, leading to the formation of gingivitis. Other factors that increase your risk for gingivitis include:   Tobacco use.  Diabetes.  Older age.  Certain medications.  Certain viral or fungal infections.  Dry mouth.  Hormonal changes such as during pregnancy.  Poor nutrition.  Substance abuse.  Poor fitting dental restorations or appliances. SYMPTOMS You may notice inflammation of the soft tissue (gingiva) around the teeth. When these tissues become inflamed, they bleed easily, especially during flossing or brushing. The gums may also be:   Tender to the touch.  Bright red, purple red, or have a shiny appearance.  Swollen.  Wearing away from the teeth (receding), which exposes more of the tooth. Bad breath is often present. Continued infection around teeth can eventually cause cavities and loosen teeth. This may lead to eventual tooth loss. DIAGNOSIS A medical and dental history will be taken. Your mouth, teeth, and gums will be examined. Your dentist will look for soft, swollen purple-red, irritated gums. There may be deposits of plaque and tartar at the base of the teeth. Your gums will be looked at for the degree of redness, puffiness, and bleeding tendencies. Your dentist will see if any of the teeth are loose. X-rays may  be taken to see if the inflammation has spread to the supporting structures of the teeth. °TREATMENT °The goal is to reduce and reverse the inflammation. Proper treatment can usually reverse the symptoms of gingivitis and prevent further progression of the disease. Have your teeth cleaned. During the cleaning, all plaque and tartar will be removed. Instruction for proper home care will be given. You will need regular professional cleanings and check-ups in the future. °HOME CARE INSTRUCTIONS °· Brush your teeth twice a day and floss at least once per day. When flossing, it is best to  floss first then brush. °· Limit sugar between meals and maintain a well-balanced diet.  °· Even the best dental hygiene will not prevent plaque from developing. It is necessary for you to see your dentist on a regular basis for cleaning and regular checkups. °· Your dentist can recommend proper oral hygiene and mouth care and suggest special toothpastes or mouth rinses. °· Stop smoking. °SEEK DENTAL OR MEDICAL CARE IF: °· You have painful, reddened tissue around your teeth, or you have puffy swollen gums. °· You have difficulty chewing. °· You notice any loose or infected teeth. °· You have swollen glands. °· Your gums bleed easily when you brush your teeth or are very tender to the touch. °Document Released: 12/05/2000 Document Revised: 09/03/2011 Document Reviewed: 09/15/2010 °ExitCare® Patient Information ©2015 [employer]. This information is not intended to replace advice given to you by your health care provider. Make sure you discuss any questions you have with your health care provider. ° °

## 2013-12-22 NOTE — ED Provider Notes (Signed)
Medical screening examination/treatment/procedure(s) were performed by non-physician practitioner and as supervising physician I was immediately available for consultation/collaboration.  Leslee Homeavid Keller, M.D.  Reuben Likesavid C Keller, MD 12/22/13 [phone]

## 2013-12-25 ENCOUNTER — Emergency Department (HOSPITAL_COMMUNITY)
Admission: EM | Admit: 2013-12-25 | Discharge: 2013-12-25 | Disposition: A | Discharge status: Home / Self Care | Payer: Self-pay | Attending: Emergency Medicine | Admitting: Emergency Medicine

## 2013-12-25 ENCOUNTER — Encounter (HOSPITAL_COMMUNITY): Payer: Self-pay | Admitting: Emergency Medicine

## 2013-12-25 DIAGNOSIS — Z87891 Personal history of nicotine dependence: Secondary | ICD-10-CM | POA: Insufficient documentation

## 2013-12-25 DIAGNOSIS — K029 Dental caries, unspecified: Secondary | ICD-10-CM | POA: Insufficient documentation

## 2013-12-25 DIAGNOSIS — K0889 Other specified disorders of teeth and supporting structures: Secondary | ICD-10-CM

## 2013-12-25 DIAGNOSIS — Z9189 Other specified personal risk factors, not elsewhere classified: Secondary | ICD-10-CM

## 2013-12-25 DIAGNOSIS — K089 Disorder of teeth and supporting structures, unspecified: Secondary | ICD-10-CM | POA: Insufficient documentation

## 2013-12-25 DIAGNOSIS — Z792 Long term (current) use of antibiotics: Secondary | ICD-10-CM | POA: Insufficient documentation

## 2013-12-25 MED ORDER — HYDROCODONE-ACETAMINOPHEN 5-325 MG PO TABS: 1.0000 | ORAL_TABLET | Freq: Four times a day (QID) | ORAL | Status: DC | PRN

## 2013-12-25 NOTE — ED Provider Notes (Signed)
CSN: [id number]     Arrival date & time 12/25/13  1851 History  This chart was scribed for Raymon MuttonMarissa Adonijah Baena, PA-C, working with Audree CamelScott T Goldston, MD by Leona CarryG. Clay Sherrill, ED Scribe. [facility] The patient's care was started at 7:40 PM.     Chief Complaint  Patient presents with  . Dental Pain    Patient is a 28 y.o. male presenting with tooth pain. The history is provided by the patient. No language interpreter was used.  Dental Pain Associated symptoms: facial swelling   Associated symptoms: no fever    HPI Comments: Charles Rollins is a 28 y.o. male with no pertinent past medical history who presents to the Emergency Department complaining of right lower dental pain beginning two weeks ago. He characterizes the pain as sharp and shooting and reports that it is a 10/10 in severity. He reports associated right facial swelling and a fever earlier today. Patient states that he chipped a tooth approximately one year ago. He denies increased sensitivity to heat or cold, CP, SOB, drainage from tooth, bleeding from tooth, or chills, difficulty swallowing.   Patient reports he was last seen by a dentist 11 years ago. He does not have a PCP.   History reviewed. No pertinent past medical history. History reviewed. No pertinent past surgical history. No family history on file. History  Substance Use Topics  . Smoking status: Former Games developermoker  . Smokeless tobacco: Never Used  . Alcohol Use: No    Review of Systems  Constitutional: Negative for fever and chills.  HENT: Positive for dental problem and facial swelling.        Denies drainage or bleeding from tooth.   Respiratory: Negative for shortness of breath.   Cardiovascular: Negative for chest pain.      Allergies  Review of patient's allergies indicates no known allergies.  Home Medications   Prior to Admission medications   Medication Sig Start Date End Date Taking? Authorizing Provider  amoxicillin  (AMOXIL) 500 MG capsule Take 2 capsules (1,000 mg total) by mouth 2 (two) times daily. 12/21/13   Hayden Rasmussenavid Mabe, NP  HYDROcodone-acetaminophen (NORCO/VICODIN) 5-325 MG per tablet Take 1 tablet by mouth every 4 (four) hours as needed. 12/21/13   Hayden Rasmussenavid Mabe, NP  HYDROcodone-acetaminophen (NORCO/VICODIN) 5-325 MG per tablet Take 1 tablet by mouth every 6 (six) hours as needed for moderate pain or severe pain. 12/25/13   Meggen Spaziani, PA-C  ibuprofen (ADVIL,MOTRIN) 200 MG tablet Take 800 mg by mouth every 6 (six) hours as needed for mild pain.    Historical Provider, MD  oxyCODONE-acetaminophen (PERCOCET/ROXICET) 5-325 MG per tablet Take 1-2 tablets by mouth every 4 (four) hours as needed for severe pain. 11/10/13   Raeford RazorStephen Kohut, MD   Triage Vitals: BP 141/91  Pulse 76  Temp(Src) 98 F (36.7 C) (Oral)  Resp 20  SpO2 100% Physical Exam  Nursing note and vitals reviewed. Constitutional: He is oriented to person, place, and time. He appears well-developed and well-nourished. No distress.  HENT:  Head: Normocephalic and atraumatic.  Mouth/Throat: Oropharynx is clear and moist. He does not have dentures. No oral lesions. No trismus in the jaw. Abnormal dentition. Dental caries present. No dental abscesses, uvula swelling or lacerations. No oropharyngeal exudate.    Negative facial swelling identified. Negative changes to skin colored. Poor dentition noted with numerous teeth missing and in decaying process. Diagrammed in decaying right second premolar of the mandibular jawline, left second premolar of  the mandibular jawline and left maxillary first premolar. Mild swelling identified to the right second premolar of the mandibular jawline with negative active drainage or bleeding noted. Negative drainable abscess identified. Uvula midline with symmetrical elevation. Negative uvula deviation. Negative sublingual lesions. Negative trismus.  Eyes: Conjunctivae and EOM are normal. Pupils are equal, round, and  reactive to light. Right eye exhibits no discharge. Left eye exhibits no discharge.  Neck: Normal range of motion. Neck supple. No tracheal deviation present.  Negative neck stiffness Negative nuchal rigidity Negative cervical lymphadenopathy  Negative meningeal signs  Negative swelling to the neck or pain upon palpation   Cardiovascular: Normal rate, regular rhythm and normal heart sounds.  Exam reveals no friction rub.   No murmur heard. Pulses:      Radial pulses are 2+ on the right side, and 2+ on the left side.  Pulmonary/Chest: Effort normal and breath sounds normal. No respiratory distress. He has no wheezes. He has no rales.  Musculoskeletal: Normal range of motion.  Full ROM to upper and lower extremities without difficulty noted, negative ataxia noted.  Lymphadenopathy:    He has no cervical adenopathy.  Neurological: He is alert and oriented to person, place, and time. No cranial nerve deficit. He exhibits normal muscle tone. Coordination normal.  Cranial nerves III-XII grossly intact Negative facial drooping  Negative slurred speech  Negative aphasia Gait proper, proper balance - negative sway, negative drift, negative step-offs  Skin: Skin is warm and dry. No rash noted. He is not diaphoretic. No erythema.  Psychiatric: He has a normal mood and affect. His behavior is normal. Thought content normal.    ED Course  Procedures (including critical care time) DIAGNOSTIC STUDIES: Oxygen Saturation is 100% on room air, normal by my interpretation.    Labs Review Labs Reviewed - No data to display  Imaging Review No results found.   EKG Interpretation None      MDM   Final diagnoses:  Pain, dental  Poor dental hygiene    Filed Vitals:   12/25/13 1858  BP: 141/91  Pulse: 76  Temp: 98 F (36.7 C)  TempSrc: Oral  Resp: 20  SpO2: 100%   I personally performed the services described in this documentation, which was scribed in my presence. The recorded  information has been reviewed and is accurate.  This provider reviewed patient's chart. Patient was seen and assessed in ED setting on 11/10/2013 where he was discharged with penicillin and Percocets. Patient was then seen on 12/21/2013 in urgent care for a toothache when he was discharged with amoxicillin and Vicodin, 15 tablets. Patient reported that he has finished the medications. Negative trismus. Uvula midline with symmetrical elevation. Negative uvula swelling. Doubt Ludwig's angina. Doubt peritonsillar abscess. Doubt retropharyngeal abscess. Negative abscess noted to be drained-doubt periapical abscess. Patient stable, afebrile. Discharged patient. Discharge patient with small dose of pain medications-discussed course, cautions, disposal technique. Patient just finished course of antibiotics-will not discharged since he just finished a course. Discussed with patient to keep appointment with dentist on Friday. Discussed with patient to closely monitor symptoms and if symptoms are to worsen or change to report back to the ED - strict return instructions given.  Patient agreed to plan of care, understood, all questions answered.   Raymon MuttonMarissa Djon Tith, PA-C 12/26/13 [phone]

## 2013-12-25 NOTE — ED Notes (Signed)
Pt A&OX4, ambulatory at discharge with steady gait, NAD. 

## 2013-12-25 NOTE — ED Notes (Signed)
Patient reports right low jaw pain. Pt has missing tooth where pain is originating from. Pain radiates R side of patients face. Ax4, NAD. Denies any other complaints.

## 2013-12-25 NOTE — Discharge Instructions (Signed)
Please call and set-up an appointment with [facility] Please keep appointment with Dentist on Friday Please apply cool compressions and warm compression, massage when doing so Please take pain medications as prescribed. While on pain medications there is to be no drinking alcohol, driving, operating any heavy machinery if there is extra please dispose in a proper manner. Please do not take any extra Tylenol with this medication for this can lead to Tylenol overdose and liver issues. Please continue to monitor symptoms closely and if symptoms are to worsen or change (fever greater than 101, chills, chest pain, shortness of breath, difficulty breathing, numbness, tingling, worsening or changes to pain pattern, swelling to the face, blurred vision, sudden loss of vision, drainage, bleeding) please report back to the ED immediately    Dental Care and Dentist Visits Dental care supports good overall health. Regular dental visits can also help you avoid dental pain, bleeding, infection, and other more serious health problems in the future. It is important to keep the mouth healthy because diseases in the teeth, gums, and other oral tissues can spread to other areas of the body. Some problems, such as diabetes, heart disease, and pre-term labor have been associated with poor oral health.  See your dentist every 6 months. If you experience emergency problems such as a toothache or broken tooth, go to the dentist right away. If you see your dentist regularly, you may catch problems early. It is easier to be treated for problems in the early stages.  WHAT TO EXPECT AT A DENTIST VISIT  Your dentist will look for many common oral health problems and recommend proper treatment. At your regular dental visit, you can expect:  Gentle cleaning of the teeth and gums. This includes scraping and polishing. This helps to remove the sticky substance around the teeth and gums (plaque). Plaque forms in the  mouth shortly after eating. Over time, plaque hardens on the teeth as tartar. If tartar is not removed regularly, it can cause problems. Cleaning also helps remove stains.  Periodic X-rays. These pictures of the teeth and supporting bone will help your dentist assess the health of your teeth.  Periodic fluoride treatments. Fluoride is a natural mineral shown to help strengthen teeth. Fluoride treatmentinvolves applying a fluoride gel or varnish to the teeth. It is most commonly done in children.  Examination of the mouth, tongue, jaws, teeth, and gums to look for any oral health problems, such as:  Cavities (dental caries). This is decay on the tooth caused by plaque, sugar, and acid in the mouth. It is best to catch a cavity when it is small.  Inflammation of the gums caused by plaque buildup (gingivitis).  Problems with the mouth or malformed or misaligned teeth.  Oral cancer or other diseases of the soft tissues or jaws. KEEP YOUR TEETH AND GUMS HEALTHY For healthy teeth and gums, follow these general guidelines as well as your dentist's specific advice:  Have your teeth professionally cleaned at the dentist every 6 months.  Brush twice daily with a fluoride toothpaste.  Floss your teeth daily.  Ask your dentist if you need fluoride supplements, treatments, or fluoride toothpaste.  Eat a healthy diet. Reduce foods and drinks with added sugar.  Avoid smoking. TREATMENT FOR ORAL HEALTH PROBLEMS If you have oral health problems, treatment varies depending on the conditions present in your teeth and gums.  Your caregiver will most likely recommend good oral hygiene at each visit.  For cavities, gingivitis, or  other oral health disease, your caregiver will perform a procedure to treat the problem. This is typically done at a separate appointment. Sometimes your caregiver will refer you to another dental specialist for specific tooth problems or for surgery. SEEK IMMEDIATE DENTAL  CARE IF:  You have pain, bleeding, or soreness in the gum, tooth, jaw, or mouth area.  A permanent tooth becomes loose or separated from the gum socket.  You experience a blow or injury to the mouth or jaw area. Document Released: 02/21/2011 Document Revised: 09/03/2011 Document Reviewed: 02/21/2011 [facility] Patient Information 2015 [place], [place]. This information is not intended to replace advice given to you by your health care provider. Make sure you discuss any questions you have with your health care provider.  Dental Pain Toothache is pain in or around a tooth. It may get worse with chewing or with cold or heat.  HOME CARE  Your dentist may use a numbing medicine during treatment. If so, you may need to avoid eating until the medicine wears off. Ask your dentist about this.  Only take medicine as told by your dentist or doctor.  Avoid chewing food near the painful tooth until after all treatment is done. Ask your dentist about this. GET HELP RIGHT AWAY IF:   The problem gets worse or new problems appear.  You have a fever.  There is redness and puffiness (swelling) of the face, jaw, or neck.  You cannot open your mouth.  There is pain in the jaw.  There is very bad pain that is not helped by medicine. MAKE SURE YOU:   Understand these instructions.  Will watch your condition.  Will get help right away if you are not doing well or get worse. Document Released: 11/28/2007 Document Revised: 09/03/2011 Document Reviewed: 11/28/2007 [facility] Patient Information 2015 [place], [place]. This information is not intended to replace advice given to you by your health care provider. Make sure you discuss any questions you have with your health care provider.   Emergency Department Resource Guide 1) Find a Doctor and Pay Out of Pocket Although you won't have to find out who is covered by your insurance plan, it is a good idea to ask around and get recommendations. You will then  need to call the office and see if the doctor you have chosen will accept you as a new patient and what types of options they offer for patients who are self-pay. Some doctors offer discounts or will set up payment plans for their patients who do not have insurance, but you will need to ask so you aren't surprised when you get to your appointment.  2) Contact Your Local Health Department Not all health departments have doctors that can see patients for sick visits, but many do, so it is worth a call to see if yours does. If you don't know where your local health department is, you can check in your phone book. The CDC also has a tool to help you locate your state's health department, and many state websites also have listings of all of their local health departments.  3) Find a Walk-in Clinic If your illness is not likely to be very severe or complicated, you may want to try a walk in clinic. These are popping up all over the country in pharmacies, drugstores, and shopping centers. They're usually staffed by nurse practitioners or physician assistants that have been trained to treat common illnesses and complaints. They're usually fairly quick and inexpensive. However, if you  have serious medical issues or chronic medical problems, these are probably not your best option.  No Primary Care Doctor: - Call Health Connect at  [phone] - they can help you locate a primary care doctor that  accepts your insurance, provides certain services, etc. - Physician Referral Service- [phone]  Chronic Pain Problems: Organization         Address  Phone   Notes  [facility]  [phone] Patients need to be referred by their primary care doctor.   Medication Assistance: Organization         Address  Phone   Notes  [facility] Medication [facility] [address] [place], [place] [address] [phone] --Must be a resident of [facility] -- Must have NO  insurance coverage whatsoever (no Medicaid/ Medicare, etc.) -- The pt. MUST have a primary care doctor that directs their care regularly and follows them in the community   [facility]ist  [phone]   [employer]  [phone]    Agencies that provide inexpensive medical care: Organization         Address  Phone   Notes  [facility]  [phone]   [facility]    [phone]   [facility] [address] [place], [place] [address] [phone]   [facility] [address] [place] [place] [phone]   Planned Parenthood    [phone]   [facility]    [phone]   Community Health and [facility]  [address], [place] Phone:  [phone], Fax:  [phone] Hours of Operation:  9 am - 6 pm, M-F.  Also accepts Medicaid/Medicare and self-pay.  [facility]  [address], [place] Phone: [phone], Fax: [phone]. Hours of Operation:  8:30 am - 5:30 pm, M-F.  Also accepts Medicaid and self-pay.  [facility] [place] [address] [place] Phone: [phone]   [facility] [address] Natasha Bence [place], [place] [phone], Ext. [phone] Mondays & Thursdays: 7-9 AM.  First 15 patients are seen on a first come, first serve basis.    Medicaid-accepting [facility] Providers:  Organization         Address  Phone   Notes  [facility] [address], [place] [phone] Also accepts self-pay patients.  [facility] [address] Laurell Josephs [place], [place]  [phone]   [facility] [address], [place] [phone]   [facility] [address] [place] [phone]   Renaye Rakers [address], [place]   [phone] Only accepts [place] Access [place] patients after they have  their name applied to their card.   Self-Pay (no insurance) in [facility]:  Organization         Address  Phone   Notes  Sickle Cell Patients, [facility] [address] [place], [place] [phone]   [facility] Urgent Care [address] [place], [place] [phone]   [facility] [place]  [address], [place] [phone]   [facility]/Dr. Osei-Bonsu  [address] [place] or [address], [place] [phone] Phone number for both [employer] and [place]  locations is the same.  Urgent Medical and [facility] [address], [place] [phone]) [phone]   [facility] [address] [place] [address], [place] or [address] [phone]) [phone]   [facility] [address], [place] [phone](336) [phone], phone; [phone]) [phone], fax Sees patients 1st and 3rd Saturday of every month.  Must not qualify for public or private insurance (i.e. Medicaid, Medicare, [place] Health Choice, Veterans' Benefits)  Household income should be no more than 200% of the poverty level The clinic cannot treat you if you are pregnant or think you are pregnant  Sexually transmitted diseases are not treated at the clinic.    Dental Care: Organization         Address  Phone  Notes  [facility] [address], [place] [phone]) [phone] Accepts children up to age 26 who are enrolled in [place] or [place] Health Choice; pregnant women with a Medicaid card; and children who have applied for Medicaid or [place] Health Choice, but were declined, whose parents can pay a reduced fee at time of service.  [facility] [place]  [address], [place] [phone]) [phone] Accepts children up to age 26 who are enrolled in [place] or [place] Health Choice; pregnant women with a Medicaid card; and children who have applied  for Medicaid or  Health Choice, but were declined, whose parents can pay a reduced fee at time of service.  [facility]  [address], [place] [phone] Patients are seen by appointment only. Walk-ins are not accepted. [facility] will see patients 26 years of age and older. Monday - Tuesday (8am-5pm) Most Wednesdays (8:30-5pm) $30 per visit, cash only  [facility]  [address], [facility] [place] [phone] Patients are seen by appointment only. Walk-ins are not accepted. [facility] will see patients 26 years of age and older. One Wednesday Evening (Monthly: Volunteer Based).  $30 per visit, cash only  [employer] [facility] [facility]  [phone] for adults; Children under age 694, call Graduate Pediatric Dentistry at [phone]) [phone]. Children aged 184-14, please call [phone] to request a pediatric application.  Dental services are provided in all areas of dental care including fillings, crowns and bridges, complete and partial dentures, implants, gum treatment, root canals, and extractions. Preventive care is also provided. Treatment is provided to both adults and children. Patients are selected via a lottery and there is often a waiting list.   [facility] [address], [place]  [phone]) [phone] [email or web address]   [facility] [address], [place], [place] [phone], Ext. [phone] Second and Fourth Thursday of each month, opens at 6:30 AM; Clinic ends at 9 AM.  Patients are seen on a first-come first-served basis, and a limited number are seen during each clinic.   [facility]  [address], [place], [place] [phone]) [phone]   Eligibility Requirements You must have lived in [place], [place], or [place] for at least the last three months.   You cannot be eligible for state or federal sponsored [place] Cityhealthcare insurance, including CIGNAVeterans Administration,  [place]icaid, or [employer]icare.   You generally cannot be eligible for healthcare insurance through your employer.    How to apply: Eligibility screenings are held every Tuesday and Wednesday afternoon from 1:00 pm until 4:00 pm. You do not need  an appointment for the interview!  [facility] [address] [place], [place] [phone]   [facility] Health Department  [phone]   [facility]  9495493251   [facility] Health Department  [phone]    Behavioral Health Resources in the Community: Intensive Outpatient Programs Organization         Address  Phone  Notes  [facility] [address] [place], [place] [phone]   [facility] Outpatient [address], [place], [place] [phone]   [facility] [address] [place], [place]  [phone]   [facility] Mental Health [address]  [place], [place] [phone] or 3808211042   Substance Abuse Resources Organization         Address  Phone  Notes  Alcohol and Drug Services  [phone]   [facility]  [phone]   [facility] [place]  [phone]   Floydene Flock  [phone]   Residential & Outpatient Substance Abuse Program  [phone]   Psychological Services Organization         Address  Phone  Notes  [facility] Behavioral Health  [phone]   [facility] Services  2096011826   [facility] Mental Health [address] [phone] or [phone]    Mobile Crisis Teams Organization         Address  Phone  Notes  [facility], Mobile Crisis Care Unit  [phone]   [facility]  [address] [place], [place] [phone]   Doristine Locks [address] [place] [phone]    Self-Help/Support Groups Organization         Address  Phone             Notes  [facility]oc[facility] of [place] - variety of support  groups  336- I7437963 Call for more information  Narcotics Anonymous (NA), Caring Services [address], [employer] [place]  2 meetings at this location   Statistician         Address  Phone  Notes  [facility] [address],    [place]  [phone]   [facility]  [address], [place] [address], [place], [place] [phone]   [facility] [address] [place], [place] [phone] Admissions: 8am-3pm M-F  [facility] [address],    [place], [place] [phone]   [facility] [address] [place], [place], [place] [phone]   [facility] [address],  [place], [place] [phone]   [facility] - Intensive Outpatient [address]urell [address], [place], [place] [phone]   [facility] (Addiction Recovery Care [facility]oc[facility] [address] [place]  [place], [place] [phone] or [phone]   [facility] (RTS) [address] [place], [place] [phone] Accepts Medicaid  [facility] [place] [address],  [place] [phone] Substance Abuse/Addiction Treatment   [facility] Organization         Address  Phone  Notes  [facility]  4103181966   Angie Fava, PhD [address] Ervin Knack [place], [place]   [phone] or [phone]   [facility] Behavioral   [address] [place], [place] [phone]   [facility] [address], [place], [place] [phone] Insurance/Medicaid/sponsorship through [employer] and Families [address]  [place], [place] [phone]) [phone] Therapy/tele-psych/case  [facility] [address]   [place], [place] [phone](336) [phone]    Dr. Lolly MustacheArfeen  [phone]   [facility] [place]  [facility] 1) [address], [place] 2) [address], [place] 3)   [address], [place] [phone](336) [phone](336) [phone]  [phone](336) [phone]   [facility] Child Abuse Hotline [phone](336) [phone] or [phone](336) [phone] (After Hours)

## 2013-12-28 NOTE — ED Provider Notes (Signed)
Medical screening examination/treatment/procedure(s) were performed by non-physician practitioner and as supervising physician I was immediately available for consultation/collaboration.   EKG Interpretation None        Sharra Cayabyab T Tiffancy Moger, MD 12/28/13 1157 

## 2014-02-16 ENCOUNTER — Encounter (HOSPITAL_COMMUNITY): Payer: Self-pay | Admitting: Emergency Medicine

## 2014-02-16 ENCOUNTER — Emergency Department (HOSPITAL_COMMUNITY)
Admission: EM | Admit: 2014-02-16 | Discharge: 2014-02-16 | Disposition: A | Discharge status: Home / Self Care | Payer: Self-pay | Attending: Emergency Medicine | Admitting: Emergency Medicine

## 2014-02-16 DIAGNOSIS — K047 Periapical abscess without sinus: Secondary | ICD-10-CM | POA: Insufficient documentation

## 2014-02-16 DIAGNOSIS — Z87891 Personal history of nicotine dependence: Secondary | ICD-10-CM | POA: Insufficient documentation

## 2014-02-16 DIAGNOSIS — K089 Disorder of teeth and supporting structures, unspecified: Secondary | ICD-10-CM | POA: Insufficient documentation

## 2014-02-16 MED ORDER — AMOXICILLIN 500 MG PO CAPS
500.0000 mg | ORAL_CAPSULE | Freq: Once | ORAL | Status: AC
  Administered 2014-02-16: 500 mg via ORAL
  Filled 2014-02-16: qty 1

## 2014-02-16 MED ORDER — HYDROCODONE-ACETAMINOPHEN 5-325 MG PO TABS: 1.0000 | ORAL_TABLET | ORAL | Status: DC | PRN

## 2014-02-16 MED ORDER — AMOXICILLIN 500 MG PO CAPS: 500.0000 mg | ORAL_CAPSULE | Freq: Three times a day (TID) | ORAL | Status: DC

## 2014-02-16 NOTE — ED Provider Notes (Signed)
CSN: [id number]     Arrival date & time 02/16/14  1927 History   First MD Initiated Contact with Patient 02/16/14 2022     Chief Complaint  Patient presents with  . Dental Pain   HPI  History provided by the patient. The patient is a 28 year old male who presents with worsening pain and swelling to the left lower mouth and jaw. Patient states she's had a problem with the left lower molar tooth for a long time and he states that he needs a root canal. He currently does not have dental insurance and has been waiting to have this before doing any procedures. Over the last 4-5 days however he began having swelling and pain to the outside, and face. Pain radiates slightly into the head. He denies any other aggravating or alleviating factors. He has been using Tylenol with very minimal relief. Denies any associated fever, chills or sweats. No swelling or pain of the tongue.   History reviewed. No pertinent past medical history. History reviewed. No pertinent past surgical history. No family history on file. History  Substance Use Topics  . Smoking status: Former Games developer  . Smokeless tobacco: Never Used  . Alcohol Use: No    Review of Systems  Constitutional: Negative for fever, chills and diaphoresis.  All other systems reviewed and are negative.     Allergies  Review of patient's allergies indicates no known allergies.  Home Medications   Prior to Admission medications   Medication Sig Start Date End Date Taking? Authorizing Provider  acetaminophen (TYLENOL) 500 MG tablet Take 500 mg by mouth every 6 (six) hours as needed for mild pain.    Historical Provider, MD  HYDROcodone-acetaminophen (NORCO/VICODIN) 5-325 MG per tablet Take 1 tablet by mouth every 6 (six) hours as needed for moderate pain or severe pain. 12/25/13   Marissa Sciacca, PA-C   BP 138/93  Pulse 106  Temp(Src) 98.7 F (37.1 C) (Oral)  Resp 16  Ht 6' (1.829 m)  Wt 167 lb (75.751 kg)  BMI 22.64 kg/m2  SpO2  100% Physical Exam  Nursing note and vitals reviewed. Constitutional: He is oriented to person, place, and time. He appears well-developed and well-nourished. No distress.  HENT:  Head: Normocephalic.  Mouth/Throat:    Nearly complete decay of the left lower first molar tooth below the gumline. There is swelling with nodular fluctuance and slight pustule to the outer adjacent gum area. No bleeding or drainage at this time.  Neck: Normal range of motion. Neck supple.  Cardiovascular: Normal rate and regular rhythm.   Pulmonary/Chest: Effort normal and breath sounds normal. No respiratory distress.  Abdominal: Soft.  Lymphadenopathy:    He has no cervical adenopathy.  Neurological: He is alert and oriented to person, place, and time.  Skin: Skin is warm.  Psychiatric: He has a normal mood and affect. His behavior is normal.    ED Course  Procedures  COORDINATION OF CARE:  Nursing notes reviewed. Vital signs reviewed. Initial pt interview and examination performed.   Filed Vitals:   02/16/14 1933  BP: 138/93  Pulse: 106  Temp: 98.7 F (37.1 C)  TempSrc: Oral  Resp: 16  Height: 6' (1.829 m)  Weight: 167 lb (75.751 kg)  SpO2: 100%    8:58 PM-patient seen and evaluated. Patient well-appearing no acute distress. Afebrile. Does not appear severely ill or toxic. I&D performed with drainage of dental abscess. Prescriptions for amoxicillin Norco provided. Dental referral given. Return precautions given.   Treatment  plan initiated:Medications - No data to display   INCISION AND DRAINAGE Performed by: Angus Seller Consent: Verbal consent obtained. Risks and benefits: risks, benefits and alternatives were discussed Type: dental abscess  Body area: left lower first molar  Anesthesia: local infiltration  Incision was made with a scalpel.  Local anesthetic: lidocaine 2% with epinephrine  Anesthetic total: 1 ml  Complexity: simple  Drainage: purulent  Drainage  amount: moderate  Packing material: none  Patient tolerance: Patient tolerated the procedure well with no immediate complications.      MDM   Final diagnoses:  Dental abscess      Angus Seller, PA-C 02/16/14 2104

## 2014-02-16 NOTE — ED Provider Notes (Signed)
Medical screening examination/treatment/procedure(s) were performed by non-physician practitioner and as supervising physician I was immediately available for consultation/collaboration.   EKG Interpretation None        Masyn Rostro N Derren Suydam, DO 02/16/14 2239 

## 2014-02-16 NOTE — ED Notes (Signed)
Pt reports left sided dental pain x months,  But reports increased pain and swelling starting on Friday. States he was suppose to have root canal but wasn't able to afford it. Denies fever/chills. NAD.

## 2014-02-16 NOTE — Discharge Instructions (Signed)
Please followup with a dentist tomorrow for continued evaluation and treatment.    Dental Abscess A dental abscess is a collection of infected fluid (pus) from a bacterial infection in the inner part of the tooth (pulp). It usually occurs at the end of the tooth's root.  CAUSES   Severe tooth decay.  Trauma to the tooth that allows bacteria to enter into the pulp, such as a broken or chipped tooth. SYMPTOMS   Severe pain in and around the infected tooth.  Swelling and redness around the abscessed tooth or in the mouth or face.  Tenderness.  Pus drainage.  Bad breath.  Bitter taste in the mouth.  Difficulty swallowing.  Difficulty opening the mouth.  Nausea.  Vomiting.  Chills.  Swollen neck glands. DIAGNOSIS   A medical and dental history will be taken.  An examination will be performed by tapping on the abscessed tooth.  X-rays may be taken of the tooth to identify the abscess. TREATMENT The goal of treatment is to eliminate the infection. You may be prescribed antibiotic medicine to stop the infection from spreading. A root canal may be performed to save the tooth. If the tooth cannot be saved, it may be pulled (extracted) and the abscess may be drained.  HOME CARE INSTRUCTIONS  Only take over-the-counter or prescription medicines for pain, fever, or discomfort as directed by your caregiver.  Rinse your mouth (gargle) often with salt water ( tsp salt in 8 oz [250 ml] of warm water) to relieve pain or swelling.  Do not drive after taking pain medicine (narcotics).  Do not apply heat to the outside of your face.  Return to your dentist for further treatment as directed. SEEK MEDICAL CARE IF:  Your pain is not helped by medicine.  Your pain is getting worse instead of better. SEEK IMMEDIATE MEDICAL CARE IF:  You have a fever or persistent symptoms for more than 2-3 days.  You have a fever and your symptoms suddenly get worse.  You have chills or a  very bad headache.  You have problems breathing or swallowing.  You have trouble opening your mouth.  You have swelling in the neck or around the eye. Document Released: 06/11/2005 Document Revised: 03/05/2012 Document Reviewed: 09/19/2010 [facility] Patient Information 2015 [place], [place]. This information is not intended to replace advice given to you by your health care provider. Make sure you discuss any questions you have with your health care provider.

## 2014-06-28 ENCOUNTER — Ambulatory Visit: Payer: Self-pay

## 2014-07-01 ENCOUNTER — Encounter (HOSPITAL_COMMUNITY): Payer: Self-pay | Admitting: Cardiology

## 2014-07-01 ENCOUNTER — Emergency Department (HOSPITAL_COMMUNITY)
Admission: EM | Admit: 2014-07-01 | Discharge: 2014-07-01 | Disposition: A | Discharge status: Home / Self Care | Payer: Self-pay | Attending: Emergency Medicine | Admitting: Emergency Medicine

## 2014-07-01 DIAGNOSIS — Z87891 Personal history of nicotine dependence: Secondary | ICD-10-CM | POA: Insufficient documentation

## 2014-07-01 DIAGNOSIS — K0889 Other specified disorders of teeth and supporting structures: Secondary | ICD-10-CM

## 2014-07-01 DIAGNOSIS — K088 Other specified disorders of teeth and supporting structures: Secondary | ICD-10-CM | POA: Insufficient documentation

## 2014-07-01 DIAGNOSIS — Z792 Long term (current) use of antibiotics: Secondary | ICD-10-CM | POA: Insufficient documentation

## 2014-07-01 DIAGNOSIS — K029 Dental caries, unspecified: Secondary | ICD-10-CM | POA: Insufficient documentation

## 2014-07-01 MED ORDER — HYDROCODONE-ACETAMINOPHEN 5-325 MG PO TABS
1.0000 | ORAL_TABLET | Freq: Once | ORAL | Status: AC
  Administered 2014-07-01: 1 via ORAL
  Filled 2014-07-01: qty 1

## 2014-07-01 MED ORDER — PENICILLIN V POTASSIUM 500 MG PO TABS: 500.0000 mg | ORAL_TABLET | Freq: Four times a day (QID) | ORAL | Status: AC

## 2014-07-01 MED ORDER — HYDROCODONE-ACETAMINOPHEN 5-325 MG PO TABS: 1.0000 | ORAL_TABLET | ORAL | Status: DC | PRN

## 2014-07-01 NOTE — ED Notes (Signed)
Pt reports that he has a bad tooth on the left side of his mouth. Reports he has not been able o get to the dentist yet.

## 2014-07-01 NOTE — ED Provider Notes (Signed)
CSN: [id number]     Arrival date & time 07/01/14  1116 History   First MD Initiated Contact with Patient 07/01/14 1152     Chief Complaint  Patient presents with  . Dental Pain   (Consider location/radiation/quality/duration/timing/severity/associated sxs/prior Treatment) HPI Charles Rollins is a 29 year old male presenting with left sided dental pain. He reports he had a broken tooth for "some time now". He states the pain has become worse however in the last week. He reports been able to drink but it causes more pain. He notes some swelling to his left cheek. Rates pain 6 out of 10. He denies any fevers, chills, nausea or vomiting.  History reviewed. No pertinent past medical history. History reviewed. No pertinent past surgical history. History reviewed. No pertinent family history. History  Substance Use Topics  . Smoking status: Former Games developer  . Smokeless tobacco: Never Used  . Alcohol Use: No    Review of Systems  Constitutional: Negative for fever and chills.  HENT: Positive for dental problem. Negative for tinnitus and trouble swallowing.   Gastrointestinal: Negative for nausea and vomiting.  Musculoskeletal: Negative for neck pain and neck stiffness.      Allergies  Review of patient's allergies indicates no known allergies.  Home Medications   Prior to Admission medications   Medication Sig Start Date End Date Taking? Authorizing Provider  acetaminophen (TYLENOL) 500 MG tablet Take 500 mg by mouth every 6 (six) hours as needed for mild pain.    Historical Provider, MD  amoxicillin (AMOXIL) 500 MG capsule Take 1 capsule (500 mg total) by mouth 3 (three) times daily. 02/16/14   Angus Seller, PA-C  HYDROcodone-acetaminophen (NORCO/VICODIN) 5-325 MG per tablet Take 1 tablet by mouth every 6 (six) hours as needed for moderate pain or severe pain. 12/25/13   Marissa Sciacca, PA-C  HYDROcodone-acetaminophen (NORCO/VICODIN) 5-325 MG per tablet Take 1 tablet by mouth every 4  (four) hours as needed for moderate pain. 02/16/14   Phill Mutter Dammen, PA-C   BP 128/82 mmHg  Pulse 70  Temp(Src) 97.9 F (36.6 C) (Oral)  Resp 18  Ht 6' (1.829 m)  Wt 180 lb (81.647 kg)  BMI 24.41 kg/m2  SpO2 97% Physical Exam  HENT:  Head:    Mouth/Throat: No trismus in the jaw. Abnormal dentition. Dental caries present. No dental abscesses.      ED Course  Procedures (including critical care time) Labs Review Labs Reviewed - No data to display  Imaging Review No results found.   EKG Interpretation None      MDM   Final diagnoses:  Pain, dental   29 yo with recurrent toothache but no gross abscess. No concerning for Ludwig's angina or spread of infection.  Discussed with pt importance of follow-up with dentist, will treat with penicillin and pain medicine.  Pt is well-appearing, in no acute distress and vital signs are stable.  They appear safe to be discharged.  Return precautions provided.    Filed Vitals:   07/01/14 1127 07/01/14 1157  BP: 138/69 128/82  Pulse: 75 70  Temp: 97.9 F (36.6 C)   TempSrc: Oral   Resp: 18 18  Height: 6' (1.829 m)   Weight: 180 lb (81.647 kg)   SpO2: 98% 97%   Meds given in ED:  Medications  HYDROcodone-acetaminophen (NORCO/VICODIN) 5-325 MG per tablet 1 tablet (1 tablet Oral Given 07/01/14 1208)    Discharge Medication List as of 07/01/2014 12:12 PM    START taking these  medications   Details  penicillin v potassium (VEETID) 500 MG tablet Take 1 tablet (500 mg total) by mouth 4 (four) times daily., Starting 07/01/2014, Until Thu 07/08/14, Print       07/01/14 0000  penicillin v potassium (VEETID) 500 MG tablet 4 times daily Discontinue Reprint 07/01/14 1210   07/01/14 0000  HYDROcodone-acetaminophen (NORCO/VICODIN) 5-325 MG per tablet Every 4 hours PRN Discontinue Reprint 07/01/14 1210         Harle BattiestElizabeth Kendell Gammon, NP 07/03/14 [id number]  Gilda Creasehristopher J. Pollina, MD 07/04/14 1654

## 2014-07-01 NOTE — Discharge Instructions (Signed)
These follow the directions provided. It is very important for you to follow-up with the dentist for further management of this dental pain. Please take antibiotics as prescribed, and the pain medicine as needed. Don't hesitate to return for any new, worsening, or concerning symptoms.   SEEK IMMEDIATE MEDICAL CARE IF:  You have a fever.  You develop redness and swelling of your face, jaw, or neck.  You are unable to open your mouth.  You have severe pain uncontrolled by pain medicine.

## 2014-08-06 ENCOUNTER — Emergency Department (HOSPITAL_COMMUNITY)
Admission: EM | Admit: 2014-08-06 | Discharge: 2014-08-06 | Disposition: A | Discharge status: Home / Self Care | Payer: Self-pay | Attending: Emergency Medicine | Admitting: Emergency Medicine

## 2014-08-06 ENCOUNTER — Encounter (HOSPITAL_COMMUNITY): Payer: Self-pay | Admitting: Emergency Medicine

## 2014-08-06 DIAGNOSIS — Z87891 Personal history of nicotine dependence: Secondary | ICD-10-CM | POA: Insufficient documentation

## 2014-08-06 DIAGNOSIS — K0889 Other specified disorders of teeth and supporting structures: Secondary | ICD-10-CM

## 2014-08-06 DIAGNOSIS — K029 Dental caries, unspecified: Secondary | ICD-10-CM | POA: Insufficient documentation

## 2014-08-06 DIAGNOSIS — K088 Other specified disorders of teeth and supporting structures: Secondary | ICD-10-CM | POA: Insufficient documentation

## 2014-08-06 MED ORDER — AMOXICILLIN 500 MG PO CAPS: 500.0000 mg | ORAL_CAPSULE | Freq: Three times a day (TID) | ORAL | Status: DC

## 2014-08-06 NOTE — ED Notes (Signed)
Patient states has been having dental pain for a while, but has never followed up with dentist.   Patient states he has been having dental problems off and on.   Today complains of left lower tooth pain.

## 2014-08-06 NOTE — ED Provider Notes (Signed)
CSN: [id number]     Arrival date & time 08/06/14  0820 History   First MD Initiated Contact with Patient 08/06/14 0825     Chief Complaint  Patient presents with  . Dental Pain     (Consider location/radiation/quality/duration/timing/severity/associated sxs/prior Treatment) Patient is a 29 y.o. male presenting with tooth pain. The history is provided by the patient. No language interpreter was used.  Dental Pain Location:  Upper and lower Lower teeth location:  19/LL 1st molar Quality:  Dull Severity:  Moderate Onset quality:  Gradual Timing:  Constant Progression:  Worsening Chronicity:  New Context: dental caries   Relieved by:  Nothing Worsened by:  Nothing tried   History reviewed. No pertinent past medical history. History reviewed. No pertinent past surgical history. No family history on file. History  Substance Use Topics  . Smoking status: Former Games developermoker  . Smokeless tobacco: Never Used  . Alcohol Use: No    Review of Systems  All other systems reviewed and are negative.     Allergies  Review of patient's allergies indicates no known allergies.  Home Medications   Prior to Admission medications   Medication Sig Start Date End Date Taking? Authorizing Provider  acetaminophen (TYLENOL) 500 MG tablet Take 500 mg by mouth every 6 (six) hours as needed for mild pain.    Historical Provider, MD  amoxicillin (AMOXIL) 500 MG capsule Take 1 capsule (500 mg total) by mouth 3 (three) times daily. 08/06/14   Elson AreasLeslie K Nyriah Coote, PA-C  HYDROcodone-acetaminophen (NORCO/VICODIN) 5-325 MG per tablet Take 1-2 tablets by mouth every 4 (four) hours as needed for moderate pain or severe pain. 07/01/14   Harle BattiestElizabeth Tysinger, NP   BP 142/85 mmHg  Pulse 79  Temp(Src) 97.3 F (36.3 C) (Oral)  SpO2 100% Physical Exam  HENT:  Head: Normocephalic and atraumatic.  Dental decay  Eyes: Pupils are equal, round, and reactive to light.  Neck: Normal range of motion.  Pulmonary/Chest:  Effort normal.  Musculoskeletal: Normal range of motion.  Neurological: He is alert.  Skin: Skin is warm.  Nursing note and vitals reviewed.   ED Course  Procedures (including critical care time) Labs Review Labs Reviewed - No data to display  Imaging Review No results found.   EKG Interpretation None      MDM   Final diagnoses:  Toothache    amosicillian Resource guide    Elson AreasLeslie K Mysha Peeler, PA-C 08/06/14 0840  Toy CookeyMegan Docherty, MD 08/09/14 [phone]

## 2014-08-06 NOTE — Discharge Instructions (Signed)
Dental Pain °A tooth ache may be caused by cavities (tooth decay). Cavities expose the nerve of the tooth to air and hot or cold temperatures. It may come from an infection or abscess (also called a boil or furuncle) around your tooth. It is also often caused by dental caries (tooth decay). This causes the pain you are having. °DIAGNOSIS  °Your caregiver can diagnose this problem by exam. °TREATMENT  °· If caused by an infection, it may be treated with medications which kill germs (antibiotics) and pain medications as prescribed by your caregiver. Take medications as directed. °· Only take over-the-counter or prescription medicines for pain, discomfort, or fever as directed by your caregiver. °· Whether the tooth ache today is caused by infection or dental disease, you should see your dentist as soon as possible for further care. °SEEK MEDICAL CARE IF: °The exam and treatment you received today has been provided on an emergency basis only. This is not a substitute for complete medical or dental care. If your problem worsens or new problems (symptoms) appear, and you are unable to meet with your dentist, call or return to this location. °SEEK IMMEDIATE MEDICAL CARE IF:  °· You have a fever. °· You develop redness and swelling of your face, jaw, or neck. °· You are unable to open your mouth. °· You have severe pain uncontrolled by pain medicine. °MAKE SURE YOU:  °· Understand these instructions. °· Will watch your condition. °· Will get help right away if you are not doing well or get worse. °Document Released: 06/11/2005 Document Revised: 09/03/2011 Document Reviewed: 01/28/2008 °ExitCare® Patient Information ©2015 [employer]. This information is not intended to replace advice given to you by your health care provider. Make sure you discuss any questions you have with your health care provider. ° °Emergency Department Resource Guide °1) Find a Doctor and Pay Out of Pocket °Although you won't have to find out who  is covered by your insurance plan, it is a good idea to ask around and get recommendations. You will then need to call the office and see if the doctor you have chosen will accept you as a new patient and what types of options they offer for patients who are self-pay. Some doctors offer discounts or will set up payment plans for their patients who do not have insurance, but you will need to ask so you aren't surprised when you get to your appointment. ° °2) Contact Your Local Health Department °Not all health departments have doctors that can see patients for sick visits, but many do, so it is worth a call to see if yours does. If you don't know where your local health department is, you can check in your phone book. The CDC also has a tool to help you locate your state's health department, and many state websites also have listings of all of their local health departments. ° °3) Find a Walk-in Clinic °If your illness is not likely to be very severe or complicated, you may want to try a walk in clinic. These are popping up all over the country in pharmacies, drugstores, and shopping centers. They're usually staffed by nurse practitioners or physician assistants that have been trained to treat common illnesses and complaints. They're usually fairly quick and inexpensive. However, if you have serious medical issues or chronic medical problems, these are probably not your best option. ° °No Primary Care Doctor: °- Call [facility] at  832-8000 - they can help you locate a primary   care doctor that  accepts your insurance, provides certain services, etc. °- Physician Referral Service- 1-800-533-3463 ° °Chronic Pain Problems: °Organization         Address  Phone   Notes  °[facility]  (336) 297-2271 Patients need to be referred by their primary care doctor.  ° °Medication Assistance: °Organization         Address  Phone   Notes  °[place] [facility] [address]  [address] °[place], [place] [address] (336) 641-8030 --Must be a resident of [place] °-- Must have NO insurance coverage whatsoever (no Medicaid/ Medicare, etc.) °-- The pt. MUST have a primary care doctor that directs their care regularly and follows them in the community °  °[facility]  (866) 331-1348   °[employer]  (888) 892-1162   ° °Agencies that provide inexpensive medical care: °Organization         Address  Phone   Notes  °[facility]  (336) 832-8035   °[facility]    (336) 832-7272   °[facility] [address] °[place], [place] [address] (336) 832-4777   °[facility] [address], °[place] (336) 271-4999   °[facility]    (336) 373-0678   °[facility]    (336) 272-1050   °[facility] ° [address], [place] Phone:  (336) 832-4444, Fax:  (336) 832-4440 Hours of Operation:  9 am - 6 pm, M-F.  Also accepts Medicaid/Medicare and self-pay.  °[facility] ° [address], [place] Phone: (336) 832-3150, Fax: (336) 832-3151. Hours of Operation:  8:30 am - 5:30 pm, M-F.  Also accepts Medicaid and self-pay.  °[facility] [place] [address], [place] Phone: (336) 878-6027   °[facility] [address], [place], [place] (336)723-1848, Ext. 123 Mondays & Thursdays: 7-9 AM.  First 15 patients are seen on a first come, first serve basis. °  ° °Medicaid-accepting [place] Providers: ° °Organization         Address  Phone   Notes  °[facility] [address], [place] (336) 641-2100 Also accepts self-pay patients.  °[facility] [address], [place] ° (336) 856-9996   °[facility] [address], [place] (336) 288-8857   °[facility] [address] [place] [address], [place] (336) 299-7000   °Veita Bland [address], [place]  ° (336) 373-1557 Only accepts [place] Access Medicaid patients after they have their name applied to their card.  ° °Self-Pay (no insurance) in [place]: ° °Organization         Address  Phone   Notes  °Sickle Cell Patients, [facility] [address], [place] (336) 832-1970   °[facility] [address], [place] (336) 832-4400   °[facility] ° [address], [place] (336) 992-4800   °[facility]/Dr. Osei-Bonsu ° [address] [place] [address], [place] or [address], [place] (336) 841-8500 Phone number for both [place] and [place] locations is the same.  °[facility] [address], [place] (336) 299-0000   °[facility] [address] [place] [address], [place] or [address] (336) 852-7530 °(336) 878-2260   °  [facility] [address], [place] (336) 350-1642, phone; (336) 294-5005, fax Sees patients 1st and 3rd Saturday of every month.  Must not qualify for public or private insurance (i.e. Medicaid, Medicare, [place] Health Choice, Veterans' Benefits) • Household income should be no more than 200% of the poverty level •The clinic cannot treat you if you are pregnant or think you are pregnant • Sexually transmitted diseases are not treated at the clinic.  ° ° °Dental Care: °Organization         Address  Phone  Notes  °[facility] [address], [place] (336) 641-6152 Accepts children up to age 21 who are enrolled in Medicaid or [place] Health Choice; pregnant women with a Medicaid card; and children who have applied for Medicaid or [place] Health Choice, but were declined, whose parents can pay a reduced fee at time of service.  °[facility] [place]  [address], [place] (336) 641-7733 Accepts children up to age 21 who are  enrolled in Medicaid or [place] Health Choice; pregnant women with a Medicaid card; and children who have applied for Medicaid or [place] Health Choice, but were declined, whose parents can pay a reduced fee at time of service.  °[facility] ° [address], [place] (336) 641-4533 Patients are seen by appointment only. Walk-ins are not accepted. [facility] will see patients 18 years of age and older. °Monday - Tuesday (8am-5pm) °Most Wednesdays (8:30-5pm) °$30 per visit, cash only  °[facility] ° [address], [place] (336) 641-4533 Patients are seen by appointment only. Walk-ins are not accepted. [facility] will see patients 18 years of age and older. °One Wednesday Evening (Monthly: Volunteer Based).  $30 per visit, cash only  °[facility]  (919) 537-3737 for adults; Children under age 4, call Graduate Pediatric Dentistry at (919) [phone]. Children aged 4-14, please call (919) [phone] to request a pediatric application. ° Dental services are provided in all areas of dental care including fillings, crowns and bridges, complete and partial dentures, implants, gum treatment, root canals, and extractions. Preventive care is also provided. Treatment is provided to both adults and children. °Patients are selected via a lottery and there is often a waiting list. °  °[facility] [address], °[place] ° (336) 763-8833 [email or web address] °  °[facility] [address], [place], [place] (336)723-1848, Ext. 123 Second and Fourth Thursday of each month, opens at 6:30 AM; Clinic ends at 9 AM.  Patients are seen on a first-come first-served basis, and a limited number are seen during each clinic.  ° °[facility] ° [address], [place], [place] (336) 723-7904   Eligibility Requirements °You must have lived in [place], [place], or [place] counties for at least the last three months. °  You  cannot be eligible for state or federal sponsored healthcare insurance, including Veterans Administration, Medicaid, or Medicare. °  You generally cannot be eligible for healthcare insurance through your employer.  °  How to apply: °Eligibility screenings are held every Tuesday and Wednesday afternoon from 1:00 pm until 4:00 pm. You do not need an appointment for the interview!  °[facility] [address], [place],  336-631-2330   °[facility] [facility]  336-342-8273   °[facility]  336-703-3100   °[facility]  336-570-6415   ° °Behavioral Health Resources in the Community: °Intensive Outpatient Programs °Organization         Address  Phone  Notes  °[facility] [address], [place], [place] 336-878-6098   °[facility] [address], [place], [place] 336-832-9800   °[facility] [address], [place], [place] ° 336-882-2125   °[facility] [address],  °[place], [place] 1-800-853-5163 or 336-641-4981   °Substance Abuse Resources °Organization         Address  Phone  Notes  °[facility]  336-882-2125   °[facility]  336-784-9470   °[facility]  336-285-9073   °[facility]  336-845-3988   °Residential & Outpatient Substance Abuse Program  1-800-659-3381   °Psychological Services °Organization         Address  Phone  Notes  °[facility]  336- 832-9600   °[facility]  336- 378-7881   °[facility] [address], [place] 1-800-853-5163 or 336-641-4981   ° °Mobile Crisis Teams °Organization         Address  Phone  Notes  °[facility], Mobile Crisis Care Unit  1-877-626-1772   °Assertive °[facility] ° [address] [place], [place] 336-834-9664   °Sharon DeEsch [address] °[place] 336-554-5454   ° °Self-Help/Support  Groups °Organization         Address  Phone             Notes  °[facility] - variety of support groups  336- 373-1402 Call for more information  °Narcotics Anonymous (NA), [facility] [address], °[place]  2 meetings at this location  ° °Residential Treatment Programs °Organization         Address  Phone  Notes  °[facility] [address],    °[place]  1-866-801-8205   °[facility] ° [address], [place], [place] 704-293-8524   °[facility] [address], [place] 336-845-3988 Admissions: 8am-3pm M-F  °[facility] [address]    °[place], [place] 336-841-1104   °[facility] [address], [place], [place] 336-379-7146   °[facility] [address]  °[place], [place] 336-285-9073   °[facility] - Intensive Outpatient [address], [place], [place] 336-852-3033   °[facility] [address]  °[place], [place] 1-877-615-2722 or 336-784-9470   °[facility] [address] [place], [place] 336-227-7417 Accepts Medicaid  °[facility] [address]  °[place] 1-800-659-3381 Substance Abuse/Addiction Treatment  ° °[place] Behavioral Health Resources °Organization         Address  Phone  Notes  °[facility]  (888) 581-9988   °Julie Brannon, PhD [address] [place], [place]   (336) 349-5553 or (336) 951-0000   °[facility]   [address] °[place], [place] (336) 349-4454   °[facility] [address], [place],  (336) 342-8316 Insurance/Medicaid/sponsorship through [facility]  °[facility] [address]                                    [place],  (336) 342-8316 Therapy/tele-psych/case  °[facility]   [address]  ° [place], [place] (336) 349-2233    °Dr. Arfeen  (336) 349-4544   °[facility] [place]  [employer] [facility] [place] [facility] 1) [address], [place] °2) [address], [place] °3)  [address], [place] (336) 349-3220 °(336) 342-7768 ° °(336) 342-8140   °[place] [facility] (336) 342-1394 or (336) 342-3537 (After Hours)    ° ° ° °

## 2014-09-23 ENCOUNTER — Emergency Department (HOSPITAL_COMMUNITY)
Admission: EM | Admit: 2014-09-23 | Discharge: 2014-09-23 | Disposition: A | Discharge status: Home / Self Care | Payer: Self-pay | Attending: Emergency Medicine | Admitting: Emergency Medicine

## 2014-09-23 ENCOUNTER — Encounter (HOSPITAL_COMMUNITY): Payer: Self-pay | Admitting: Emergency Medicine

## 2014-09-23 DIAGNOSIS — K088 Other specified disorders of teeth and supporting structures: Secondary | ICD-10-CM | POA: Insufficient documentation

## 2014-09-23 DIAGNOSIS — K0889 Other specified disorders of teeth and supporting structures: Secondary | ICD-10-CM

## 2014-09-23 DIAGNOSIS — K0381 Cracked tooth: Secondary | ICD-10-CM | POA: Insufficient documentation

## 2014-09-23 DIAGNOSIS — Z87891 Personal history of nicotine dependence: Secondary | ICD-10-CM | POA: Insufficient documentation

## 2014-09-23 DIAGNOSIS — Z792 Long term (current) use of antibiotics: Secondary | ICD-10-CM | POA: Insufficient documentation

## 2014-09-23 MED ORDER — PENICILLIN V POTASSIUM 500 MG PO TABS: 500.0000 mg | ORAL_TABLET | Freq: Four times a day (QID) | ORAL | Status: DC

## 2014-09-23 NOTE — ED Provider Notes (Signed)
CSN: [id number]     Arrival date & time 09/23/14  [id number] History  This chart was scribed for non-physician practitioner, Emilia BeckKaitlyn Embrie Mikkelsen, PA-C, working with Eber HongBrian Miller, MD, by Abel PrestoKara Demonbreun, ED Scribe. This patient was seen in room TR07C/TR07C and the patient's care was started at 9:43 AM.    Chief Complaint  Patient presents with  . Dental Pain    Patient is a 29 y.o. male presenting with tooth pain. The history is provided by the patient. No language interpreter was used.  Dental Pain Location:  Lower Lower teeth location:  18/LL 2nd molar Quality:  Pulsating Severity:  Severe Onset quality:  Sudden Duration:  2 days Timing:  Constant Progression:  Unchanged Chronicity:  New Associated symptoms: no facial swelling and no fever    HPI Comments: Charles Rollins A Zani is a 29 y.o. male who presents to the Emergency Department complaining of sharp left lower dental pain with onset a week ago. Charles Rollins notes a cracked tooth to the area. Charles Rollins reports associated gingival swelling. Charles Rollins c/o and associated headache but notes he has been taking Tylenol with relief. Charles Rollins denies fever and lymphadenopathy. Charles Rollins does not have a dentist.   History reviewed. No pertinent past medical history. History reviewed. No pertinent past surgical history. No family history on file. History  Substance Use Topics  . Smoking status: Former Games developermoker  . Smokeless tobacco: Never Used  . Alcohol Use: No    Review of Systems  Constitutional: Negative for fever.  HENT: Positive for dental problem. Negative for facial swelling and trouble swallowing.   Hematological: Negative for adenopathy.  All other systems reviewed and are negative.     Allergies  Review of patient's allergies indicates no known allergies.  Home Medications   Prior to Admission medications   Medication Sig Start Date End Date Taking? Authorizing Provider  acetaminophen (TYLENOL) 500 MG tablet Take 500 mg by mouth every 6 (six) hours as needed  for mild pain.    Historical Provider, MD  amoxicillin (AMOXIL) 500 MG capsule Take 1 capsule (500 mg total) by mouth 3 (three) times daily. 08/06/14   Elson AreasLeslie K Sofia, PA-C  HYDROcodone-acetaminophen (NORCO/VICODIN) 5-325 MG per tablet Take 1-2 tablets by mouth every 4 (four) hours as needed for moderate pain or severe pain. 07/01/14   Harle BattiestElizabeth Tysinger, NP  penicillin v potassium (VEETID) 500 MG tablet Take 1 tablet (500 mg total) by mouth 4 (four) times daily. 09/23/14   Jayd Cadieux, PA-C   BP 128/77 mmHg  Pulse 89  Temp(Src) 97.9 F (36.6 C) (Oral)  Resp 18 Physical Exam  Constitutional: He is oriented to person, place, and time. He appears well-developed and well-nourished. No distress.  HENT:  Head: Normocephalic and atraumatic.  Mouth/Throat: No dental abscesses.  Cracked left lower 2nd molar with tenderness to percussion; no abscess noted  Eyes: Conjunctivae are normal.  Neck: Normal range of motion. Neck supple.  Cardiovascular: Normal rate and regular rhythm.  Exam reveals no gallop and no friction rub.   No murmur heard. Pulmonary/Chest: Effort normal and breath sounds normal. He has no wheezes. He has no rales. He exhibits no tenderness.  Abdominal: Soft. There is no tenderness.  Musculoskeletal: Normal range of motion.  Lymphadenopathy:    He has no cervical adenopathy.  Neurological: He is alert and oriented to person, place, and time.  Speech is goal-oriented. Moves limbs without ataxia.   Skin: Skin is warm and dry.  Psychiatric: He has a normal mood and affect.  His behavior is normal.  Nursing note and vitals reviewed.   ED Course  Procedures (including critical care time) DIAGNOSTIC STUDIES:  COORDINATION OF CARE: 9:46 AM Discussed treatment plan with patient at beside, the patient agrees with the plan and has no further questions at this time.   Labs Review Labs Reviewed - No data to display  Imaging Review No results found.   EKG  Interpretation None      MDM   Final diagnoses:  Pain, dental   No signs of ludwigs angina. Patient will have veetid and dental referral. Vitals stable and patient afebrile.   I personally performed the services described in this documentation, which was scribed in my presence. The recorded information has been reviewed and is accurate.     Emilia Beck, PA-C 09/23/14 1008  Eber Hong, MD 09/23/14 [phone]

## 2014-09-23 NOTE — Discharge Instructions (Signed)
Take Veetid as directed until gone. Follow up with the recommended dentist. Refer to attached documents for more information. Return to the ED with worsening or concerning symptoms.

## 2014-09-23 NOTE — ED Notes (Signed)
Patient states started having left lower dental pain in gums around decayed teeth.  Patient states is supposed to go to dentist soon, but his insurance doesn't kick in for a while.   Patient states he just needs antibiotic for the swelling.  Patient states has used tylenol at home and it seems to help.

## 2014-09-27 ENCOUNTER — Encounter (HOSPITAL_COMMUNITY): Payer: Self-pay | Admitting: Emergency Medicine

## 2014-09-27 ENCOUNTER — Emergency Department (HOSPITAL_COMMUNITY)
Admission: EM | Admit: 2014-09-27 | Discharge: 2014-09-27 | Disposition: A | Discharge status: Home / Self Care | Payer: Self-pay | Attending: Emergency Medicine | Admitting: Emergency Medicine

## 2014-09-27 DIAGNOSIS — Z87891 Personal history of nicotine dependence: Secondary | ICD-10-CM | POA: Insufficient documentation

## 2014-09-27 DIAGNOSIS — K047 Periapical abscess without sinus: Secondary | ICD-10-CM | POA: Insufficient documentation

## 2014-09-27 MED ORDER — CLINDAMYCIN HCL 150 MG PO CAPS: 150.0000 mg | ORAL_CAPSULE | Freq: Four times a day (QID) | ORAL | Status: DC

## 2014-09-27 MED ORDER — HYDROCODONE-ACETAMINOPHEN 5-325 MG PO TABS: 1.0000 | ORAL_TABLET | ORAL | Status: DC | PRN

## 2014-09-27 NOTE — Discharge Instructions (Signed)
Dental Abscess °A dental abscess is a collection of infected fluid (pus) from a bacterial infection in the inner part of the tooth (pulp). It usually occurs at the end of the tooth's root.  °CAUSES  °· Severe tooth decay. °· Trauma to the tooth that allows bacteria to enter into the pulp, such as a broken or chipped tooth. °SYMPTOMS  °· Severe pain in and around the infected tooth. °· Swelling and redness around the abscessed tooth or in the mouth or face. °· Tenderness. °· Pus drainage. °· Bad breath. °· Bitter taste in the mouth. °· Difficulty swallowing. °· Difficulty opening the mouth. °· Nausea. °· Vomiting. °· Chills. °· Swollen neck glands. °DIAGNOSIS  °· A medical and dental history will be taken. °· An examination will be performed by tapping on the abscessed tooth. °· X-rays may be taken of the tooth to identify the abscess. °TREATMENT °The goal of treatment is to eliminate the infection. You may be prescribed antibiotic medicine to stop the infection from spreading. A root canal may be performed to save the tooth. If the tooth cannot be saved, it may be pulled (extracted) and the abscess may be drained.  °HOME CARE INSTRUCTIONS °· Only take over-the-counter or prescription medicines for pain, fever, or discomfort as directed by your caregiver. °· Rinse your mouth (gargle) often with salt water (¼ tsp salt in 8 oz [250 ml] of warm water) to relieve pain or swelling. °· Do not drive after taking pain medicine (narcotics). °· Do not apply heat to the outside of your face. °· Return to your dentist for further treatment as directed. °SEEK MEDICAL CARE IF: °· Your pain is not helped by medicine. °· Your pain is getting worse instead of better. °SEEK IMMEDIATE MEDICAL CARE IF: °· You have a fever or persistent symptoms for more than 2-3 days. °· You have a fever and your symptoms suddenly get worse. °· You have chills or a very bad headache. °· You have problems breathing or swallowing. °· You have trouble  opening your mouth. °· You have swelling in the neck or around the eye. °Document Released: 06/11/2005 Document Revised: 03/05/2012 Document Reviewed: 09/19/2010 °ExitCare® Patient Information ©2015 [employer]. This information is not intended to replace advice given to you by your health care provider. Make sure you discuss any questions you have with your health care provider. ° ° °Emergency Department Resource Guide °1) Find a Doctor and Pay Out of Pocket °Although you won't have to find out who is covered by your insurance plan, it is a good idea to ask around and get recommendations. You will then need to call the office and see if the doctor you have chosen will accept you as a new patient and what types of options they offer for patients who are self-pay. Some doctors offer discounts or will set up payment plans for their patients who do not have insurance, but you will need to ask so you aren't surprised when you get to your appointment. ° °2) Contact Your Local Health Department °Not all health departments have doctors that can see patients for sick visits, but many do, so it is worth a call to see if yours does. If you don't know where your local health department is, you can check in your phone book. The CDC also has a tool to help you locate your state's health department, and many state websites also have listings of all of their local health departments. ° °3) Find a Walk-in   Clinic °If your illness is not likely to be very severe or complicated, you may want to try a walk in clinic. These are popping up all over the country in pharmacies, drugstores, and shopping centers. They're usually staffed by nurse practitioners or physician assistants that have been trained to treat common illnesses and complaints. They're usually fairly quick and inexpensive. However, if you have serious medical issues or chronic medical problems, these are probably not your best option. ° °No Primary Care Doctor: °- Call  [facility] at  832-8000 - they can help you locate a primary care doctor that  accepts your insurance, provides certain services, etc. °- Physician Referral Service- 1-800-533-3463 ° °Chronic Pain Problems: °Organization         Address  Phone   Notes  °[facility]  (336) 297-2271 Patients need to be referred by their primary care doctor.  ° °Medication Assistance: °Organization         Address  Phone   Notes  °[place] [facility] [address] °[place], [place] [address] (336) 641-8030 --Must be a resident of [place] °-- Must have NO insurance coverage whatsoever (no Medicaid/ Medicare, etc.) °-- The pt. MUST have a primary care doctor that directs their care regularly and follows them in the community °  °[facility]  (866) 331-1348   °[employer]  (888) 892-1162   ° °Agencies that provide inexpensive medical care: °Organization         Address  Phone   Notes  °[facility]  (336) 832-8035   °[facility]    (336) 832-7272   °[facility] [address] °[place], [place] [address] (336) 832-4777   °[facility] [address], °[place] (336) 271-4999   °[facility]    (336) 373-0678   °[facility]    (336) 272-1050   °[facility] ° [address], [place] Phone:  (336) 832-4444, Fax:  (336) 832-4440 Hours of Operation:  9 am - 6 pm, M-F.  Also accepts Medicaid/Medicare and self-pay.  °[facility] ° [address], [place] Phone: (336) 832-3150, Fax: (336) 832-3151. Hours of Operation:  8:30 am - 5:30 pm, M-F.  Also accepts Medicaid and self-pay.  °[facility] [place] [address], [place] Phone: (336) 878-6027   °[facility] [address], [place], [place] (336)723-1848, Ext. 123 Mondays & Thursdays: 7-9 AM.  First 15 patients are seen on a first come, first serve  basis. °  ° °Medicaid-accepting [place] Providers: ° °Organization         Address  Phone   Notes  °[facility] [address], [place] (336) 641-2100 Also accepts self-pay patients.  °[facility] [address], [place] ° (336) 856-9996   °[facility] [address], [place] (336) 288-8857   °[facility] [address] [place] [address], [place] (336) 299-7000   °Veita Bland [address],   ° (336) 373-1557 Only accepts [place] Access Medicaid patients after they have their name applied to their card.  ° °Self-Pay (no insurance) in [place]: ° °Organization         Address  Phone   Notes  °Sickle Cell Patients, [facility] [address],  (  336) 832-1970   °[facility] [address], [place] (336) 832-4400   °[facility] ° [address], [place] (336) 992-4800   °[facility]/Dr. Osei-Bonsu ° [address] [place] [address], [place] or [address], [place] (336) 841-8500 Phone number for both [place] and [place] locations is the same.  °[facility] [address], [place] (336) 299-0000   °[facility] [address] [place] [address], [place] or [address] (336) 852-7530 °(336) 878-2260   °[facility] [address], [place] (336) 350-1642, phone; (336) 294-5005, fax Sees patients 1st and 3rd Saturday of every month.  Must not qualify for public or private insurance (i.e. Medicaid, Medicare, [place] Health Choice, Veterans' Benefits) • Household income should be no more than 200% of the poverty level •The clinic cannot treat you if you are pregnant or think you are pregnant • Sexually transmitted diseases are not treated at the clinic.  ° ° °Dental Care: °Organization         Address  Phone  Notes  °[place]  [facility] [address], [place] (336) 641-6152 Accepts children up to age 21 who are enrolled in Medicaid or [place] Health Choice; pregnant women with a Medicaid card; and children who have applied for Medicaid or [place] Health Choice, but were declined, whose parents can pay a reduced fee at time of service.  °[facility] [place]  [address], [place] (336) 641-7733 Accepts children up to age 21 who are enrolled in Medicaid or [place] Health Choice; pregnant women with a Medicaid card; and children who have applied for Medicaid or [place] Health Choice, but were declined, whose parents can pay a reduced fee at time of service.  °[facility] ° [address], [place] (336) 641-4533 Patients are seen by appointment only. Walk-ins are not accepted. [facility] will see patients 18 years of age and older. °Monday - Tuesday (8am-5pm) °Most Wednesdays (8:30-5pm) °$30 per visit, cash only  °[facility] ° [address], [place] (336) 641-4533 Patients are seen by appointment only. Walk-ins are not accepted. [facility] will see patients 18 years of age and older. °One Wednesday Evening (Monthly: Volunteer Based).  $30 per visit, cash only  °[facility]  (919) 537-3737 for adults; Children under age 4, call Graduate Pediatric Dentistry at (919) [phone]. Children aged 4-14, please call (919) 537-3737 to request a pediatric application. ° Dental services are provided in all areas of dental care including fillings, crowns and bridges, complete and partial dentures, implants, gum treatment, root canals, and extractions. Preventive care is also provided. Treatment is provided to both adults and children. °Patients are selected via a lottery and there is often a waiting list. °  °[facility] [address], °[place] ° (336) 763-8833  [email or web address].[email or web address] °  °[facility] [address], [place], [place] (336)723-1848, Ext. 123 Second and Fourth Thursday of each month, opens at 6:30 AM; Clinic ends at 9 AM.  Patients are seen on a first-come first-served basis, and a limited number are seen during each clinic.  ° °[facility] ° [address], [place], [place] (336) 723-7904   Eligibility Requirements °You must have lived in [place], [place], or [place] counties   for at least the last three months. °  You cannot be eligible for state or federal sponsored healthcare insurance, including Veterans Administration, Medicaid, or Medicare. °  You generally cannot be eligible for healthcare insurance through your employer.  °  How to apply: °Eligibility screenings are held every Tuesday and Wednesday afternoon from 1:00 pm until 4:00 pm. You do not need an appointment for the interview!  °[facility] [address], [place], [place] 336-631-2330   °[facility]  336-342-8273   °[facility]  336-703-3100   °[facility]  336-570-6415   ° °Behavioral Health Resources in the Community: °Intensive Outpatient Programs °Organization         Address  Phone  Notes  °[facility] [address], [place], [place] 336-878-6098   °[facility] [address], [place], [place] 336-832-9800   °[facility] [address], [place], [place] ° 336-882-2125   °[facility] [address],  °[place], [place] 1-800-853-5163 or 336-641-4981   °Substance Abuse Resources °Organization         Address  Phone  Notes  °[facility]  336-882-2125   °[facility]  336-784-9470   °[facility]  336-285-9073   °[facility]  336-845-3988   °Residential & Outpatient Substance Abuse Program  1-800-659-3381   °Psychological Services °Organization          Address  Phone  Notes  ° Health  336- 832-9600   °[facility]  336- 378-7881   °[facility] [address], [place] 1-800-853-5163 or 336-641-4981   ° °Mobile Crisis Teams °Organization         Address  Phone  Notes  °[facility], Mobile Crisis Care Unit  1-877-626-1772   °Assertive °[facility] ° [address] [place], [place] 336-834-9664   °Sharon DeEsch [address] °[place] 336-554-5454   ° °Self-Help/Support Groups °Organization         Address  Phone             Notes  °[facility] [place] - variety of support groups  336- 373-1402 Call for more information  °Narcotics Anonymous (NA), [facility] [address], °[place]  2 meetings at this location  ° °Residential Treatment Programs °Organization         Address  Phone  Notes  °[facility] [address],    °[place]  1-866-801-8205   °[facility] ° [address], [place], [place] 704-293-8524   °[facility] [address], [place] 336-845-3988 Admissions: 8am-3pm M-F  °[facility] [address]    °[place], [place] 336-841-1104   °[facility] [address], [place], [place] 336-379-7146   °[facility] [address]  °[place], [place] 336-285-9073   °[facility] Intensive Outpatient [address], [place], [place] 336-852-3033   °[facility] [address]  °[place], [place] 1-877-615-2722 or 336-784-9470   °[facility] [address] [place], [place] 336-227-7417 Accepts Medicaid  °[facility] [address]  °  1-800-659-3381 Substance Abuse/Addiction Treatment  ° °[place] Behavioral Health Resources °Organization         Address  Phone  Notes  °[facility]  (888)   581-9988   °Julie Brannon, PhD [address] [place], [place]   (336) 349-5553 or (336) 951-0000   °[facility]   [address] °[place], [place] (336) 349-4454   °[facility] [address], [place], [place] (336) 342-8316 Insurance/Medicaid/sponsorship through [facility]  °[facility] [address]                                    [place], [place] (336) 342-8316 Therapy/tele-psych/case  °[facility] [address]  ° [place], [place] (336) 349-2233    °Dr. Arfeen  (336) 349-4544   °[facility] [place]  [employer] [facility] [place] [facility] 1) [address],  °2) [address], [place] °3)  [address], [place] (336) 349-3220 °(336) 342-7768 ° °(336) 342-8140   °[place] [facility] (336) 342-1394 or (336) 342-3537 (After Hours)    ° ° ° °

## 2014-09-27 NOTE — ED Notes (Signed)
Declined W/C at D/C and was escorted to lobby by RN. 

## 2014-09-27 NOTE — ED Notes (Signed)
Pt st's he was seen here a few days ago for dental pain.  Is currently taking antibiotic for same.  St's he missed his dental appt this am  Because he had to work.  Left side of face swollen

## 2014-09-28 NOTE — ED Provider Notes (Signed)
CSN: [id number]     Arrival date & time 09/27/14  1502 History   First MD Initiated Contact with Patient 09/27/14 1538     Chief Complaint  Patient presents with  . Dental Pain   (Consider location/radiation/quality/duration/timing/severity/associated sxs/prior Treatment) Patient is a 29 y.o. male presenting with tooth pain. The history is provided by the patient. No language interpreter was used.  Dental Pain Location:  Upper Quality:  No pain Severity:  Moderate Onset quality:  Sudden Duration:  3 days Timing:  Constant Progression:  Worsening Chronicity:  New Context: normal dentition   Previous work-up:  Dental exam Relieved by:  Nothing Worsened by:  Nothing tried Ineffective treatments:  None tried Associated symptoms: no congestion     History reviewed. No pertinent past medical history. History reviewed. No pertinent past surgical history. No family history on file. History  Substance Use Topics  . Smoking status: Former Games developer  . Smokeless tobacco: Never Used  . Alcohol Use: No    Review of Systems  HENT: Positive for dental problem. Negative for congestion.   All other systems reviewed and are negative.   Allergies  Review of patient's allergies indicates no known allergies.  Home Medications   Prior to Admission medications   Medication Sig Start Date End Date Taking? Authorizing Provider  acetaminophen (TYLENOL) 500 MG tablet Take 500 mg by mouth every 6 (six) hours as needed for mild pain.   Yes Historical Provider, MD  amoxicillin (AMOXIL) 500 MG capsule Take 1 capsule (500 mg total) by mouth 3 (three) times daily. Patient not taking: Reported on 09/27/2014 08/06/14   Elson Areas, PA-C  clindamycin (CLEOCIN) 150 MG capsule Take 1 capsule (150 mg total) by mouth every 6 (six) hours. 09/27/14   Elson Areas, PA-C  HYDROcodone-acetaminophen (NORCO/VICODIN) 5-325 MG per tablet Take 1-2 tablets by mouth every 4 (four) hours as needed for moderate pain or  severe pain. 09/27/14   Elson Areas, PA-C  penicillin v potassium (VEETID) 500 MG tablet Take 1 tablet (500 mg total) by mouth 4 (four) times daily. Patient not taking: Reported on 09/27/2014 09/23/14   Kaitlyn Szekalski, PA-C   BP 133/76 mmHg  Pulse 73  Temp(Src) 98.2 F (36.8 C)  Resp 18  Ht 6' (1.829 m)  Wt 166 lb (75.297 kg)  BMI 22.51 kg/m2  SpO2 100% Physical Exam  Constitutional: He is oriented to person, place, and time. He appears well-developed and well-nourished.  HENT:  Head: Normocephalic and atraumatic.  Right Ear: External ear normal.  Left Ear: External ear normal.  Pointing abscess on gum line. swelling  Eyes: Pupils are equal, round, and reactive to light.  Neck: Normal range of motion.  Cardiovascular: Normal rate.   Pulmonary/Chest: Effort normal.  Abdominal: Soft.  Musculoskeletal: Normal range of motion.  Neurological: He is alert and oriented to person, place, and time. He has normal reflexes.  Skin: Skin is warm.  Psychiatric: He has a normal mood and affect.  Nursing note and vitals reviewed.   ED Course  Dental Date/Time: 09/28/2014 1:19 PM Performed by: Elson Areas Authorized by: Margarita Grizzle Risks and benefits: risks, benefits and alternatives were discussed Patient understanding: patient states understanding of the procedure being performed Patient identity confirmed: verbally with patient Time out: Immediately prior to procedure a "time out" was called to verify the correct patient, procedure, equipment, support staff and site/side marked as required. Patient tolerance: Patient tolerated the procedure well with no immediate complications Comments:  Abscess opened with 18 gauge needle  Purulent drainage.   (including critical care time) Labs Review Labs Reviewed - No data to display  Imaging Review No results found.   MDM   1. Dental abscess    Meds ordered this encounter  Medications  . HYDROcodone-acetaminophen (NORCO/VICODIN)  5-325 MG per tablet    Sig: Take 1-2 tablets by mouth every 4 (four) hours as needed for moderate pain or severe pain.    Dispense:  20 tablet    Refill:  0    Order Specific Question:  Supervising Provider    Answer:  MILLER, BRIAN [[id number]]  . clindamycin (CLEOCIN) 150 MG capsule    Sig: Take 1 capsule (150 mg total) by mouth every 6 (six) hours.    Dispense:  28 capsule    Refill:  0    Order Specific Question:  Supervising Provider    Answer:  Eber HongMILLER, BRIAN [[id number]]   Pt given dental resource guide AVS    Elson AreasLeslie K Kenady Doxtater, PA-C 09/28/14 1320  Margarita Grizzleanielle Ray, MD 09/28/14 1348

## 2014-12-24 ENCOUNTER — Emergency Department (HOSPITAL_COMMUNITY)
Admission: EM | Admit: 2014-12-24 | Discharge: 2014-12-24 | Disposition: A | Discharge status: Home / Self Care | Payer: Self-pay | Attending: Emergency Medicine | Admitting: Emergency Medicine

## 2014-12-24 ENCOUNTER — Encounter (HOSPITAL_COMMUNITY): Payer: Self-pay | Admitting: Emergency Medicine

## 2014-12-24 DIAGNOSIS — Z87891 Personal history of nicotine dependence: Secondary | ICD-10-CM | POA: Insufficient documentation

## 2014-12-24 DIAGNOSIS — K047 Periapical abscess without sinus: Secondary | ICD-10-CM | POA: Insufficient documentation

## 2014-12-24 DIAGNOSIS — K0381 Cracked tooth: Secondary | ICD-10-CM | POA: Insufficient documentation

## 2014-12-24 DIAGNOSIS — Z792 Long term (current) use of antibiotics: Secondary | ICD-10-CM | POA: Insufficient documentation

## 2014-12-24 MED ORDER — CLINDAMYCIN HCL 150 MG PO CAPS: 150.0000 mg | ORAL_CAPSULE | Freq: Four times a day (QID) | ORAL | Status: DC

## 2014-12-24 NOTE — ED Provider Notes (Signed)
CSN: [id number]     Arrival date & time 12/24/14  0718 History   First MD Initiated Contact with Patient 12/24/14 [phone]     Chief Complaint  Patient presents with  . Dental Pain     (Consider location/radiation/quality/duration/timing/severity/associated sxs/prior Treatment) HPI Comments: Pt comes in with c/o left lower dental abscess. He has a known fractured tooth. He hasn't seen a dentist. States that she is not having problems swallowing or breathing.  The history is provided by the patient. No language interpreter was used.    History reviewed. No pertinent past medical history. History reviewed. No pertinent past surgical history. No family history on file. History  Substance Use Topics  . Smoking status: Former Games developermoker  . Smokeless tobacco: Never Used  . Alcohol Use: Yes    Review of Systems  Constitutional: Negative.   Respiratory: Negative.   Cardiovascular: Negative.       Allergies  Review of patient's allergies indicates no known allergies.  Home Medications   Prior to Admission medications   Medication Sig Start Date End Date Taking? Authorizing Provider  acetaminophen (TYLENOL) 500 MG tablet Take 500 mg by mouth every 6 (six) hours as needed for mild pain.    Historical Provider, MD  amoxicillin (AMOXIL) 500 MG capsule Take 1 capsule (500 mg total) by mouth 3 (three) times daily. Patient not taking: Reported on 09/27/2014 08/06/14   Elson AreasLeslie K Sofia, PA-C  clindamycin (CLEOCIN) 150 MG capsule Take 1 capsule (150 mg total) by mouth every 6 (six) hours. 12/24/14   Teressa LowerVrinda Karlisha Mathena, NP  HYDROcodone-acetaminophen (NORCO/VICODIN) 5-325 MG per tablet Take 1-2 tablets by mouth every 4 (four) hours as needed for moderate pain or severe pain. 09/27/14   Elson AreasLeslie K Sofia, PA-C  penicillin v potassium (VEETID) 500 MG tablet Take 1 tablet (500 mg total) by mouth 4 (four) times daily. Patient not taking: Reported on 09/27/2014 09/23/14   Kaitlyn Szekalski, PA-C   BP 122/76 mmHg   Pulse 81  Temp(Src) 97.5 F (36.4 C) (Oral)  Resp 18  Ht 6' (1.829 m)  Wt 165 lb (74.844 kg)  BMI 22.37 kg/m2  SpO2 99% Physical Exam  Constitutional: He appears well-developed and well-nourished.  HENT:  Right Ear: External ear normal.  Left Ear: External ear normal.  Mouth/Throat:    Cardiovascular: Normal rate and regular rhythm.   Pulmonary/Chest: Effort normal and breath sounds normal.  Nursing note and vitals reviewed.   ED Course  Procedures (including critical care time) Labs Review Labs Reviewed - No data to display  Imaging Review No results found.   EKG Interpretation None      MDM   Final diagnoses:  Dental abscess    Given clindamycin and dental follow up. No sign of ludwigs angina    Teressa LowerVrinda Travontae Freiberger, NP 12/24/14 [phone]  Donnetta HutchingBrian Cook, MD 12/24/14 [phone]

## 2014-12-24 NOTE — Discharge Instructions (Signed)

## 2014-12-24 NOTE — ED Notes (Signed)
Patient states dental abscess to L lower tooth.   Patient states he has had a broken tooth x 1 year.   Has not seen dentist for same.

## 2015-07-06 ENCOUNTER — Emergency Department (HOSPITAL_COMMUNITY): Payer: Self-pay

## 2015-07-06 ENCOUNTER — Encounter (HOSPITAL_COMMUNITY): Payer: Self-pay | Admitting: *Deleted

## 2015-07-06 ENCOUNTER — Emergency Department (HOSPITAL_COMMUNITY)
Admission: EM | Admit: 2015-07-06 | Discharge: 2015-07-06 | Disposition: A | Discharge status: Home / Self Care | Payer: Self-pay | Attending: Emergency Medicine | Admitting: Emergency Medicine

## 2015-07-06 DIAGNOSIS — Z87891 Personal history of nicotine dependence: Secondary | ICD-10-CM | POA: Insufficient documentation

## 2015-07-06 DIAGNOSIS — Z792 Long term (current) use of antibiotics: Secondary | ICD-10-CM | POA: Insufficient documentation

## 2015-07-06 DIAGNOSIS — R1110 Vomiting, unspecified: Secondary | ICD-10-CM | POA: Insufficient documentation

## 2015-07-06 DIAGNOSIS — J069 Acute upper respiratory infection, unspecified: Secondary | ICD-10-CM | POA: Insufficient documentation

## 2015-07-06 LAB — COMPREHENSIVE METABOLIC PANEL
ALBUMIN: 3.9 g/dL (ref 3.5–5.0)
ALT: 27 U/L (ref 17–63)
ANION GAP: 10 (ref 5–15)
AST: 38 U/L (ref 15–41)
Alkaline Phosphatase: 62 U/L (ref 38–126)
BUN: 12 mg/dL (ref 6–20)
CHLORIDE: 103 mmol/L (ref 101–111)
CO2: 23 mmol/L (ref 22–32)
Calcium: 9.3 mg/dL (ref 8.9–10.3)
Creatinine, Ser: 1.28 mg/dL — ABNORMAL HIGH (ref 0.61–1.24)
GFR calc Af Amer: >60 mL/min (ref >60)
GFR calc non Af Amer: >60 mL/min (ref >60)
GLUCOSE: 111 mg/dL — AB (ref 65–99)
POTASSIUM: 3.6 mmol/L (ref 3.5–5.1)
SODIUM: 136 mmol/L (ref 135–145)
TOTAL PROTEIN: 7.1 g/dL (ref 6.5–8.1)
Total Bilirubin: 1.3 mg/dL — ABNORMAL HIGH (ref 0.3–1.2)

## 2015-07-06 LAB — URINE MICROSCOPIC-ADD ON

## 2015-07-06 LAB — URINALYSIS, ROUTINE W REFLEX MICROSCOPIC
Glucose, UA: NEGATIVE mg/dL
Hgb urine dipstick: NEGATIVE
Ketones, ur: 15 mg/dL — AB
Leukocytes, UA: NEGATIVE
NITRITE: NEGATIVE
PH: 5.5 (ref 5.0–8.0)
Protein, ur: 30 mg/dL — AB
SPECIFIC GRAVITY, URINE: 1.038 — AB (ref 1.005–1.030)

## 2015-07-06 LAB — CBC
HEMATOCRIT: 46.4 % (ref 39.0–52.0)
HEMOGLOBIN: 15.7 g/dL (ref 13.0–17.0)
MCH: 31.1 pg (ref 26.0–34.0)
MCHC: 33.8 g/dL (ref 30.0–36.0)
MCV: 91.9 fL (ref 78.0–100.0)
Platelets: 170 10*3/uL (ref 150–400)
RBC: 5.05 MIL/uL (ref 4.22–5.81)
RDW: 12.6 % (ref 11.5–15.5)
WBC: 8 10*3/uL (ref 4.0–10.5)

## 2015-07-06 LAB — LIPASE, BLOOD: LIPASE: 20 U/L (ref 11–51)

## 2015-07-06 MED ORDER — ACETAMINOPHEN 325 MG PO TABS
ORAL_TABLET | ORAL | Status: AC
  Filled 2015-07-06: qty 2

## 2015-07-06 MED ORDER — ACETAMINOPHEN 325 MG PO TABS
650.0000 mg | ORAL_TABLET | Freq: Once | ORAL | Status: AC
  Administered 2015-07-06: 650 mg via ORAL

## 2015-07-06 NOTE — ED Notes (Signed)
Pt reports cough, chills, headache, Nausea/vomitting for 2 days.

## 2015-07-06 NOTE — Discharge Instructions (Signed)
Upper Respiratory Infection, Adult Most upper respiratory infections (URIs) are caused by a virus. A URI affects the nose, throat, and upper air passages. The most common type of URI is often called "the common cold." HOME CARE   Take medicines only as told by your doctor.  Gargle warm saltwater or take cough drops to comfort your throat as told by your doctor.  Use a warm mist humidifier or inhale steam from a shower to increase air moisture. This may make it easier to breathe.  Drink enough fluid to keep your pee (urine) clear or pale yellow.  Eat soups and other clear broths.  Have a healthy diet.  Rest as needed.  Go back to work when your fever is gone or your doctor says it is okay.  You may need to stay home longer to avoid giving your URI to others.  You can also wear a face mask and wash your hands often to prevent spread of the virus.  Use your inhaler more if you have asthma.  Do not use any tobacco products, including cigarettes, chewing tobacco, or electronic cigarettes. If you need help quitting, ask your doctor. GET HELP IF:  You are getting worse, not better.  Your symptoms are not helped by medicine.  You have chills.  You are getting more short of breath.  You have brown or red mucus.  You have yellow or brown discharge from your nose.  You have pain in your face, especially when you bend forward.  You have a fever.  You have puffy (swollen) neck glands.  You have pain while swallowing.  You have white areas in the back of your throat. GET HELP RIGHT AWAY IF:   You have very bad or constant:  Headache.  Ear pain.  Pain in your forehead, behind your eyes, and over your cheekbones (sinus pain).  Chest pain.  You have long-lasting (chronic) lung disease and any of the following:  Wheezing.  Long-lasting cough.  Coughing up blood.  A change in your usual mucus.  You have a stiff neck.  You have changes in  your:  Vision.  Hearing.  Thinking.  Mood. MAKE SURE YOU:   Understand these instructions.  Will watch your condition.  Will get help right away if you are not doing well or get worse.   This information is not intended to replace advice given to you by your health care provider. Make sure you discuss any questions you have with your health care provider.   Document Released: 11/28/2007 Document Revised: 10/26/2014 Document Reviewed: 09/16/2013 Elsevier Interactive Patient Education 2016 [employer]   Please read attached information. If you experience any new or worsening signs or symptoms please return to the emergency room for evaluation. Please follow-up with your primary care provider or specialist as discussed. Please use medication prescribed only as directed and discontinue taking if you have any concerning signs or symptoms.

## 2015-07-06 NOTE — ED Provider Notes (Signed)
CSN: [id number]     Arrival date & time 07/06/15  1105 History  By signing my name below, I, Ronney LionSuzanne Le, attest that this documentation has been prepared under the direction and in the presence of Newell RubbermaidJeffrey Taela Charbonneau, PA-C. Electronically Signed: Ronney LionSuzanne Le, ED Scribe. 07/06/2015. 3:21 PM.    Chief Complaint  Patient presents with  . Cough  . Emesis   The history is provided by the patient. No language interpreter was used.    HPI Comments: Charles Rollins is a 30 y.o. male who presents to the Emergency Department complaining of a a gradual-onset, constant, moderate, worsening productive cough that began last night. He also complains of associated chills, fever, generalized myalgias, headache, rhinorrhea, and watery eyes. Patient states he is a smoker but denies a history of asthma. He reports he has not tried any treatments at home for his symptoms. Nothing makes his symptoms better or worse. 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 He denies abdominal pain, vomiting, or diarrhea.   History reviewed. No pertinent past medical history. History reviewed. No pertinent past surgical history. No family history on file. Social History  Substance Use Topics  . Smoking status: Former Games developermoker  . Smokeless tobacco: Never Used  . Alcohol Use: Yes    Review of Systems A complete 10 system review of systems was obtained and all systems are negative except as noted in the HPI and PMH.    Allergies  Review of patient's allergies indicates no known allergies.  Home Medications   Prior to Admission medications   Medication Sig Start Date End Date Taking? Authorizing Provider  acetaminophen (TYLENOL) 500 MG tablet Take 500 mg by mouth every 6 (six) hours as needed for mild pain.    Historical Provider, MD  amoxicillin (AMOXIL) 500 MG capsule Take 1 capsule (500 mg total) by mouth 3 (three) times daily. Patient not taking: Reported on 09/27/2014 08/06/14   Elson AreasLeslie K Sofia, PA-C  clindamycin  (CLEOCIN) 150 MG capsule Take 1 capsule (150 mg total) by mouth every 6 (six) hours. 12/24/14   Teressa LowerVrinda Pickering, NP  HYDROcodone-acetaminophen (NORCO/VICODIN) 5-325 MG per tablet Take 1-2 tablets by mouth every 4 (four) hours as needed for moderate pain or severe pain. 09/27/14   Elson AreasLeslie K Sofia, PA-C  penicillin v potassium (VEETID) 500 MG tablet Take 1 tablet (500 mg total) by mouth 4 (four) times daily. Patient not taking: Reported on 09/27/2014 09/23/14   Kaitlyn Szekalski, PA-C   BP 127/73 mmHg  Pulse 105  Temp(Src) 101 F (38.3 C) (Oral)  Resp 16  SpO2 99% Physical Exam  Constitutional: He is oriented to person, place, and time. He appears well-developed and well-nourished. No distress.  HENT:  Head: Normocephalic and atraumatic.  Right Ear: Hearing, tympanic membrane, external ear and ear canal normal.  Left Ear: Hearing, tympanic membrane, external ear and ear canal normal.  Mouth/Throat: No oropharyngeal exudate.  Tonsils bilaterally enlarged but symmetrical. No exudate.   Eyes: Conjunctivae and EOM are normal.  Neck: Neck supple. No tracheal deviation present.  Cardiovascular: Normal rate, regular rhythm and normal heart sounds.  Exam reveals no gallop and no friction rub.   No murmur heard. Pulmonary/Chest: Effort normal and breath sounds normal. No respiratory distress. He has no wheezes. He has no rales. He exhibits no tenderness.  Lungs are clear to auscultation bilaterally.   Musculoskeletal: Normal range of motion.  Neurological: He is alert and oriented to person, place, and time.  Skin: Skin is warm  and dry.  Psychiatric: He has a normal mood and affect. His behavior is normal.  Nursing note and vitals reviewed.   ED Course  Procedures (including critical care time)  DIAGNOSTIC STUDIES: Oxygen Saturation is 99% on RA, normal by my interpretation.    COORDINATION OF CARE: 3:21 PM - Discussed treatment plan with pt at bedside which includes rest, fluids, and healthy  foods during recovery. Pt verbalized understanding and agreed to plan.   Labs Review Labs Reviewed  COMPREHENSIVE METABOLIC PANEL - Abnormal; Notable for the following:    Glucose, Bld 111 (*)    Creatinine, Ser 1.28 (*)    Total Bilirubin 1.3 (*)    All other components within normal limits  URINALYSIS, ROUTINE W REFLEX MICROSCOPIC (NOT AT [facility]) - Abnormal; Notable for the following:    Color, Urine AMBER (*)    APPearance CLOUDY (*)    Specific Gravity, Urine 1.038 (*)    Bilirubin Urine SMALL (*)    Ketones, ur 15 (*)    Protein, ur 30 (*)    All other components within normal limits  URINE MICROSCOPIC-ADD ON - Abnormal; Notable for the following:    Squamous Epithelial / LPF 0-5 (*)    Bacteria, UA RARE (*)    Crystals CA OXALATE CRYSTALS (*)    All other components within normal limits  LIPASE, BLOOD  CBC    Imaging Review Dg Chest 2 View  07/06/2015  CLINICAL DATA:  Cough for 2 days.  Initial encounter. EXAM: CHEST  2 VIEW COMPARISON:  None. FINDINGS: The lungs are clear. Heart size is normal. There is no pneumothorax or pleural effusion. No bony abnormality. IMPRESSION: Negative chest. Electronically Signed   By: Drusilla Kanner M.D.   On: 07/06/2015 14:11   I have personally reviewed and evaluated these images and lab results as part of my medical decision-making.  MDM   Final diagnoses:  URI (upper respiratory infection)   Labs:  Imaging: DG Chest 2 View  Consults:  Therapeutics: Tylenol 650 mg, once  Discharge Meds:   Assessment/Plan:   Pt CXR negative for acute infiltrate. Patients symptoms are consistent with URI, likely viral etiology. Discussed that antibiotics are not indicated for viral infections. Pt will be discharged with symptomatic treatment.  Verbalizes understanding and is agreeable with plan. Advised rest and fluids. Will give work note for several days. Strict return precautions given and discussed. Pt is hemodynamically stable & in NAD  prior to dc.   I personally performed the services described in this documentation, which was scribed in my presence. The recorded information has been reviewed and is accurate.     Eyvonne Mechanic, PA-C 07/06/15 1521  Arby Barrette, MD 07/07/15 1343

## 2016-05-11 ENCOUNTER — Encounter (HOSPITAL_COMMUNITY): Payer: Self-pay

## 2016-05-11 ENCOUNTER — Ambulatory Visit (HOSPITAL_COMMUNITY)
Admission: EM | Admit: 2016-05-11 | Discharge: 2016-05-11 | Disposition: A | Discharge status: Home / Self Care | Payer: Self-pay | Attending: Internal Medicine | Admitting: Internal Medicine

## 2016-05-11 DIAGNOSIS — B029 Zoster without complications: Secondary | ICD-10-CM

## 2016-05-11 MED ORDER — HYDROCODONE-ACETAMINOPHEN 5-325 MG PO TABS: 1.0000 | ORAL_TABLET | Freq: Four times a day (QID) | ORAL | 0 refills | Status: DC | PRN

## 2016-05-11 MED ORDER — PREDNISONE 50 MG PO TABS: 50.0000 mg | ORAL_TABLET | Freq: Every day | ORAL | 0 refills | Status: DC

## 2016-05-11 MED ORDER — FAMCICLOVIR 500 MG PO TABS: 500.0000 mg | ORAL_TABLET | Freq: Three times a day (TID) | ORAL | 0 refills | Status: AC

## 2016-05-11 NOTE — ED Triage Notes (Signed)
Pt said he had a rash starting on Tuesday and its on his right side of his chest and arm also radiating to the top of his back. No fever and has applied alcohol. Does itch/ache

## 2016-05-11 NOTE — Discharge Instructions (Addendum)
Rash is suggestive of shingles (herpes zoster), although if you have several episodes of similar rash in the same place would consider the possibility of atypical fever blister (herpes virus, related to shingles).  Prescriptions for prednisone (decrease pain), famciclovir (anti virus medicine) were sent to the [facility] on [address].  Pain medicine prescription (vicodin) was printed.  Anticipate gradual improvement in pain/rash over the next 2-3 weeks.  Rash is contagious until fully scabbed up and pregnant women/newborns/old people are at the greatest risk for complications if they catch this virus.

## 2016-05-11 NOTE — ED Provider Notes (Signed)
[facility]    CSN: [id number] Arrival date & time: 05/11/16  1242     History   Chief Complaint Chief Complaint  Patient presents with  . Rash    HPI Charles Rollins is a 30 y.o. male. He presents today with pain and rash in the right upper chest and shoulder, this started 3 days ago. His grandmother is a Engineer, civil (consulting)nurse, and told him she thought it looked like shingles. He had chickenpox as a child.    HPI  History reviewed. No pertinent past medical history.  History reviewed. No pertinent surgical history.    Home Medications    Prior to Admission medications   Medication Sig Start Date End Date Taking? Authorizing Provider  acetaminophen (TYLENOL) 500 MG tablet Take 500 mg by mouth every 6 (six) hours as needed for mild pain.   Yes Historical Provider, MD  famciclovir (FAMVIR) 500 MG tablet Take 1 tablet (500 mg total) by mouth 3 (three) times daily. 05/11/16 05/18/16  Eustace MooreLaura W Khayla Koppenhaver, MD  HYDROcodone-acetaminophen (NORCO/VICODIN) 5-325 MG tablet Take 1 tablet by mouth 4 (four) times daily as needed. 05/11/16   Eustace MooreLaura W Deeric Cruise, MD  predniSONE (DELTASONE) 50 MG tablet Take 1 tablet (50 mg total) by mouth daily. 05/11/16   Eustace MooreLaura W Brooklyn Alfredo, MD    Family History No family history on file.  Social History Social History  Substance Use Topics  . Smoking status: Former Games developermoker  . Smokeless tobacco: Never Used  . Alcohol use Yes     Allergies   Patient has no known allergies.   Review of Systems Review of Systems  All other systems reviewed and are negative.    Physical Exam Triage Vital Signs ED Triage Vitals  Enc Vitals Group     BP 05/11/16 1255 118/68     Pulse Rate 05/11/16 1255 77     Resp 05/11/16 1255 16     Temp 05/11/16 1255 98.1 F (36.7 C)     Temp Source 05/11/16 1255 Oral     SpO2 05/11/16 1255 99 %     Weight 05/11/16 1257 159 lb (72.1 kg)     Height 05/11/16 1257 6' (1.829 m)     Pain Score 05/11/16 1257 0   Updated Vital  Signs BP 118/68 (BP Location: Left Arm)   Pulse 77   Temp 98.1 F (36.7 C) (Oral)   Resp 16   Ht 6' (1.829 m)   Wt 159 lb (72.1 kg)   SpO2 99%   BMI 21.56 kg/m  Physical Exam  Constitutional: He is oriented to person, place, and time. No distress.  Alert, nicely groomed  HENT:  Head: Atraumatic.  Eyes:  Conjugate gaze, no eye redness/drainage  Neck: Neck supple.  Cardiovascular: Normal rate.   Pulmonary/Chest: No respiratory distress.  Abdominal: He exhibits no distension.  Musculoskeletal: Normal range of motion.  Neurological: He is alert and oriented to person, place, and time.  Skin: Skin is warm and dry.     No cyanosis Over the right upper arm, right upper chest, right shoulder blade area, there are several red/raised patches, studded with tiny vesicles. These are tender, consistent with shingles.  Nursing note and vitals reviewed.    UC Treatments / Results   Procedures Procedures (including critical care time) None today  Final Clinical Impressions(s) / UC Diagnoses   Final diagnoses:  Herpes zoster without complication   Rash is suggestive of shingles (herpes zoster), although if you have several episodes  of similar rash in the same place would consider the possibility of atypical fever blister (herpes virus, related to shingles).  Prescriptions for prednisone (decrease pain), famciclovir (anti virus medicine) [facility] [address]  Pain medicine prescription (vicodin) was printed.  Anticipate gradual improvement in pain/rash over the next 2-3 weeks.  Rash is contagious until fully scabbed up and pregnant women/newborns/old people are at the greatest risk for complications if they catch this virus.  New Prescriptions New Prescriptions   FAMCICLOVIR (FAMVIR) 500 MG TABLET    Take 1 tablet (500 mg total) by mouth 3 (three) times daily.   HYDROCODONE-ACETAMINOPHEN (NORCO/VICODIN) 5-325 MG TABLET    Take 1 tablet by mouth 4 (four) times daily as  needed.   PREDNISONE (DELTASONE) 50 MG TABLET    Take 1 tablet (50 mg total) by mouth daily.     Eustace MooreLaura W Shantal Roan, MD 05/11/16 [phone]

## 2016-11-13 ENCOUNTER — Ambulatory Visit (HOSPITAL_COMMUNITY)
Admission: EM | Admit: 2016-11-13 | Discharge: 2016-11-13 | Disposition: A | Discharge status: Other Acute Inpatient Hospital | Payer: Self-pay

## 2017-01-26 IMAGING — CR DG CHEST 2V
2 series · 2 of 2 positions shown · non-contrast
Comparison: None.

CLINICAL DATA: Cough for 2 days.  Initial encounter.

EXAM:
CHEST  2 VIEW

[chest pa]
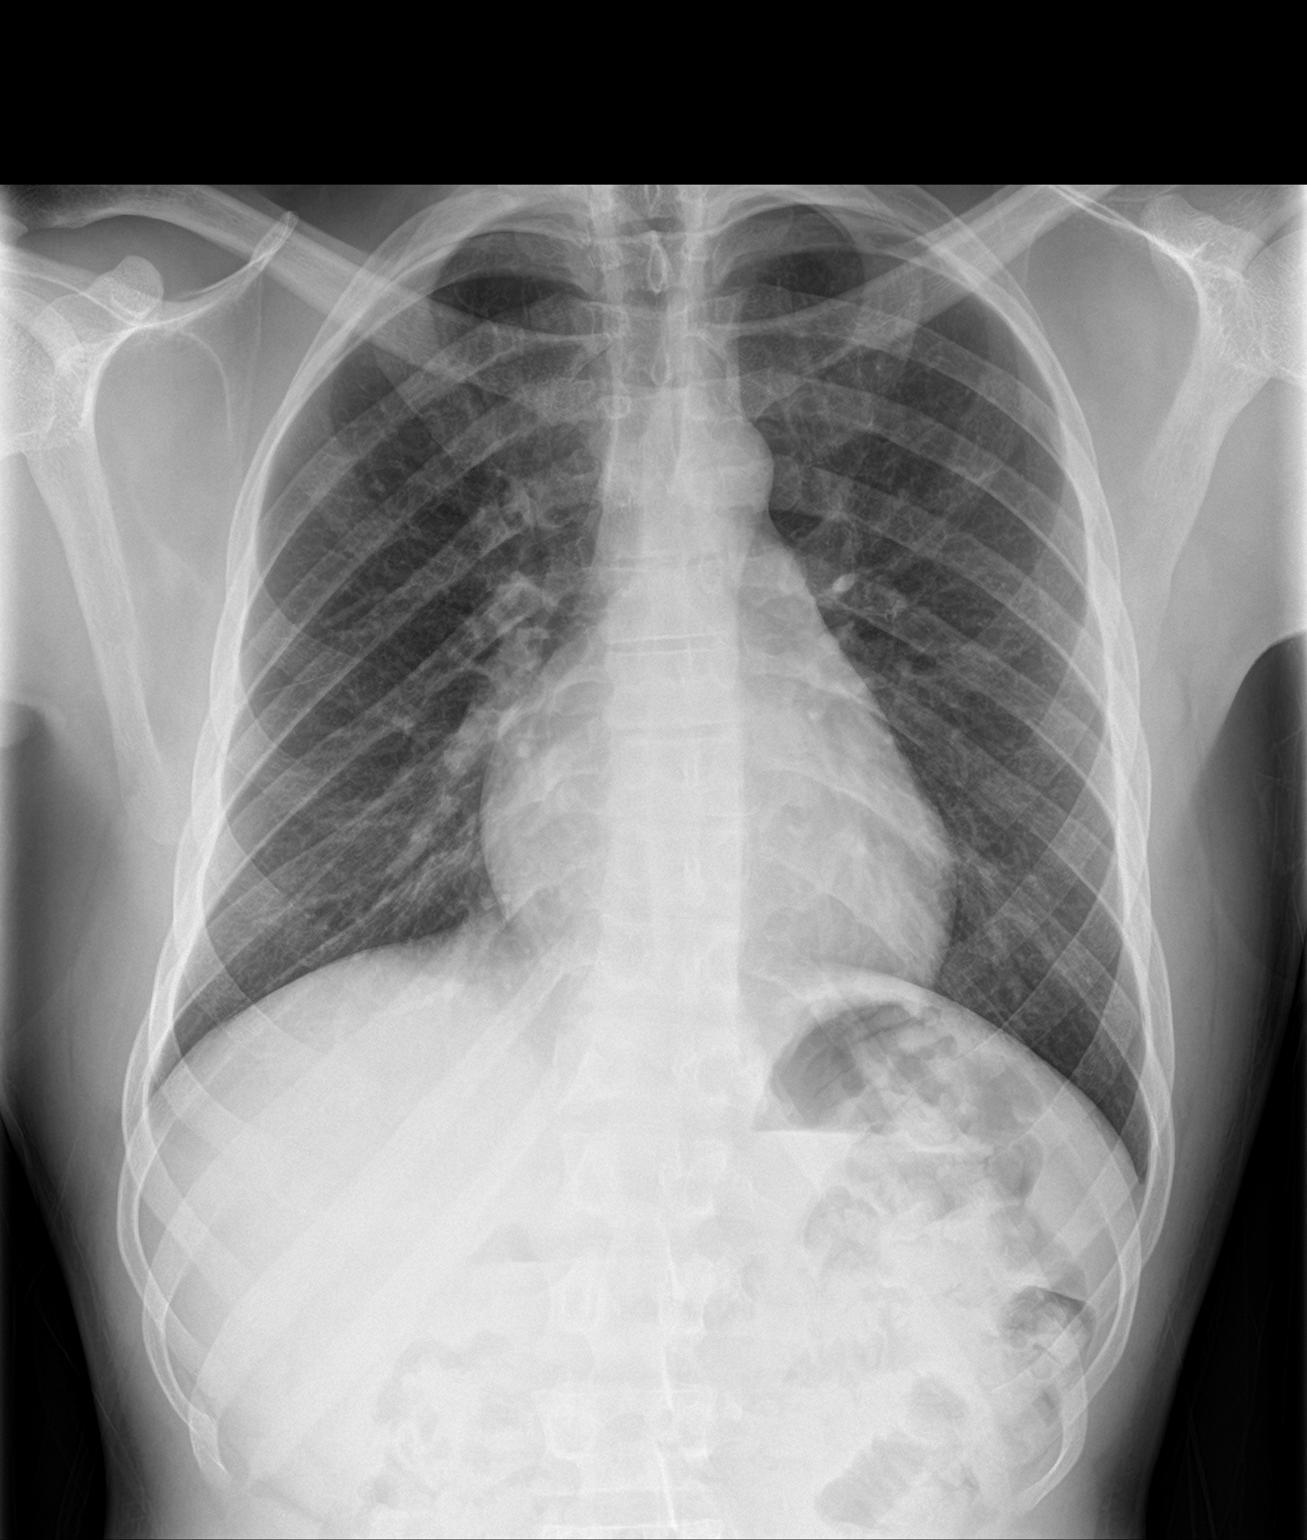

[chest lat]
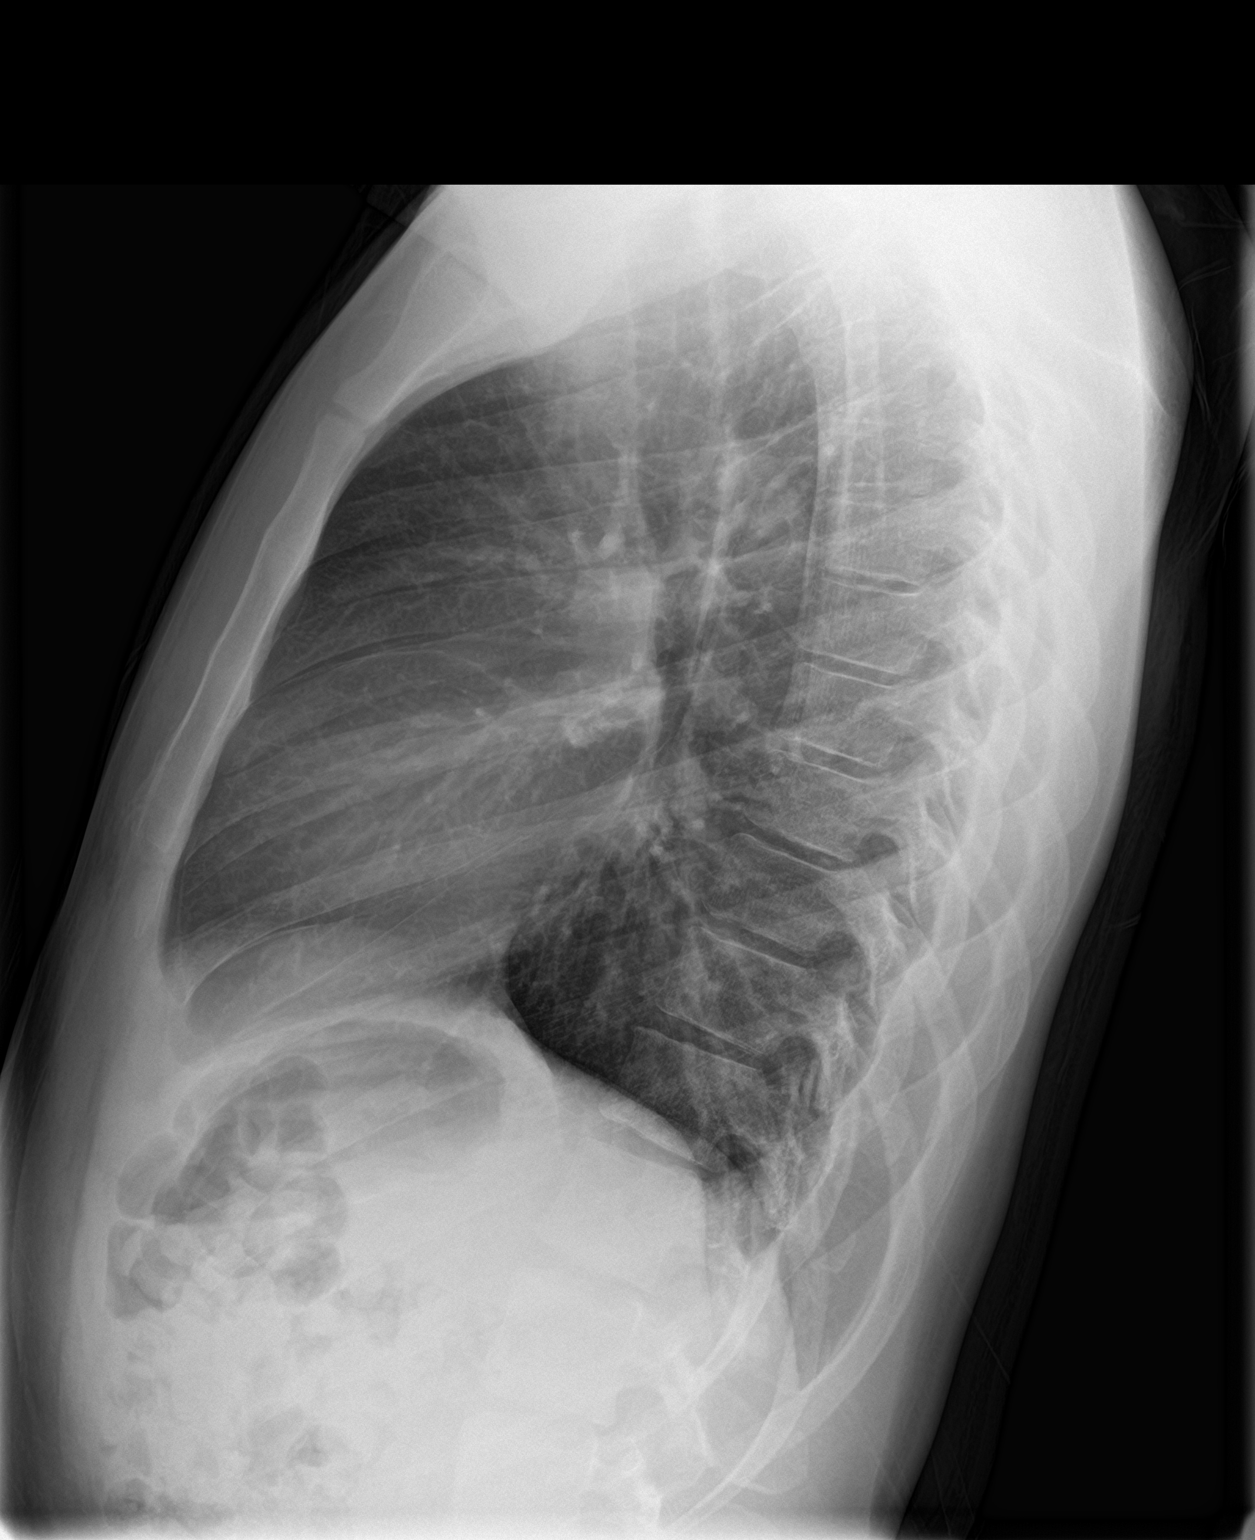

[2 of 2 positions shown; findings below may reference images not displayed]

FINDINGS: The lungs are clear. Heart size is normal. There is no pneumothorax
or pleural effusion. No bony abnormality.
IMPRESSION: Negative chest.

## 2017-06-04 ENCOUNTER — Encounter (HOSPITAL_BASED_OUTPATIENT_CLINIC_OR_DEPARTMENT_OTHER): Payer: Self-pay | Admitting: Adult Health

## 2017-06-04 ENCOUNTER — Other Ambulatory Visit: Payer: Self-pay

## 2017-06-04 ENCOUNTER — Emergency Department (HOSPITAL_BASED_OUTPATIENT_CLINIC_OR_DEPARTMENT_OTHER)
Admission: EM | Admit: 2017-06-04 | Discharge: 2017-06-04 | Disposition: A | Discharge status: Home / Self Care | Payer: Self-pay | Attending: Emergency Medicine | Admitting: Emergency Medicine

## 2017-06-04 DIAGNOSIS — Z79899 Other long term (current) drug therapy: Secondary | ICD-10-CM | POA: Insufficient documentation

## 2017-06-04 DIAGNOSIS — Z202 Contact with and (suspected) exposure to infections with a predominantly sexual mode of transmission: Secondary | ICD-10-CM | POA: Insufficient documentation

## 2017-06-04 DIAGNOSIS — Z87891 Personal history of nicotine dependence: Secondary | ICD-10-CM | POA: Insufficient documentation

## 2017-06-04 DIAGNOSIS — R369 Urethral discharge, unspecified: Secondary | ICD-10-CM | POA: Insufficient documentation

## 2017-06-04 LAB — URINALYSIS, ROUTINE W REFLEX MICROSCOPIC
Bilirubin Urine: NEGATIVE
Glucose, UA: NEGATIVE mg/dL
HGB URINE DIPSTICK: NEGATIVE
KETONES UR: 15 mg/dL — AB
NITRITE: NEGATIVE
Protein, ur: NEGATIVE mg/dL
SPECIFIC GRAVITY, URINE: 1.025 (ref 1.005–1.030)
pH: 7 (ref 5.0–8.0)

## 2017-06-04 LAB — URINALYSIS, MICROSCOPIC (REFLEX)

## 2017-06-04 MED ORDER — CEFTRIAXONE SODIUM 250 MG IJ SOLR
250.0000 mg | Freq: Once | INTRAMUSCULAR | Status: AC
  Administered 2017-06-04: 250 mg via INTRAMUSCULAR
  Filled 2017-06-04: qty 250

## 2017-06-04 MED ORDER — ONDANSETRON 4 MG PO TBDP
4.0000 mg | ORAL_TABLET | Freq: Once | ORAL | Status: AC
  Administered 2017-06-04: 4 mg via ORAL
  Filled 2017-06-04: qty 1

## 2017-06-04 MED ORDER — METRONIDAZOLE 500 MG PO TABS
2000.0000 mg | ORAL_TABLET | Freq: Once | ORAL | Status: AC
  Administered 2017-06-04: 2000 mg via ORAL
  Filled 2017-06-04: qty 4

## 2017-06-04 MED ORDER — AZITHROMYCIN 250 MG PO TABS
1000.0000 mg | ORAL_TABLET | Freq: Once | ORAL | Status: AC
  Administered 2017-06-04: 1000 mg via ORAL
  Filled 2017-06-04: qty 4

## 2017-06-04 NOTE — ED Triage Notes (Signed)
Presents with white penile discharge and some lower abdominal cramping. He is concerned for an STD. GF is here to be checked as well.

## 2017-06-04 NOTE — ED Provider Notes (Signed)
[facility] EMERGENCY DEPARTMENT Provider Note   CSN: [id number] Arrival date & time: 06/04/17  0932     History   Chief Complaint Chief Complaint  Patient presents with  . Exposure to STD    HPI  Charles Rollins is a 31 y.o. Male with no pertinent past medical history, who presents with 2 days of white penile discharge and dysuria. Patient reports his partner is here with similar symptoms. Patient denies abdominal pain, rectal pain or pain with defecation.  Patient denies any genital lesions. No swelling erythema or tenderness of the testes or scrotum. Patient reports one previous occurrence several years ago. Patient would like full STD testing today.      History reviewed. No pertinent past medical history.  There are no active problems to display for this patient.   History reviewed. No pertinent surgical history.     Home Medications    Prior to Admission medications   Medication Sig Start Date End Date Taking? Authorizing Provider  acetaminophen (TYLENOL) 500 MG tablet Take 500 mg by mouth every 6 (six) hours as needed for mild pain.    [provider]  HYDROcodone-acetaminophen (NORCO/VICODIN) 5-325 MG tablet Take 1 tablet by mouth 4 (four) times daily as needed. 05/11/16   Eustace MooreMurray, Laura W, MD  predniSONE (DELTASONE) 50 MG tablet Take 1 tablet (50 mg total) by mouth daily. 05/11/16   Eustace MooreMurray, Laura W, MD    Family History History reviewed. No pertinent family history.  Social History Social History   Tobacco Use  . Smoking status: Former Games developermoker  . Smokeless tobacco: Never Used  Substance Use Topics  . Alcohol use: Yes  . Drug use: Yes    Types: Marijuana     Allergies   Patient has no known allergies.   Review of Systems Review of Systems  Constitutional: Negative for chills and fever.  HENT: Negative for sore throat.   Gastrointestinal: Negative for abdominal pain, constipation, diarrhea, nausea, rectal pain and  vomiting.  Genitourinary: Positive for discharge and dysuria. Negative for genital sores, hematuria, penile pain, penile swelling, scrotal swelling and testicular pain.  Skin: Negative for rash.     Physical Exam Updated Vital Signs BP 121/78   Pulse 70   Temp 97.7 F (36.5 C) (Oral)   Resp 18   SpO2 100%   Physical Exam  Constitutional: Charles Rollins appears well-developed and well-nourished. No distress.  HENT:  Head: Normocephalic and atraumatic.  Eyes: Right eye exhibits no discharge. Left eye exhibits no discharge.  Pulmonary/Chest: Effort normal. No respiratory distress.  Abdominal: Soft. Bowel sounds are normal. Charles Rollins exhibits no distension and no mass. There is no tenderness. There is no guarding.  Genitourinary:  Genitourinary Comments: No genital lesions present, no penile swelling or tenderness, no discharge present at the urinary meatus, no tenderness, swelling or erythema of the testes or scrotum  Neurological: Charles Rollins is alert. Coordination normal.  Skin: Skin is warm and dry. Capillary refill takes less than 2 seconds. Charles Rollins is not diaphoretic.  Psychiatric: Charles Rollins has a normal mood and affect. His behavior is normal.  Nursing note and vitals reviewed.    ED Treatments / Results  Labs (all labs ordered are listed, but only abnormal results are displayed) Labs Reviewed  URINALYSIS, ROUTINE W REFLEX MICROSCOPIC - Abnormal; Notable for the following components:      Result Value   Ketones, ur 15 (*)    Leukocytes, UA TRACE (*)    All other components within normal  limits  URINALYSIS, MICROSCOPIC (REFLEX) - Abnormal; Notable for the following components:   Bacteria, UA MANY (*)    Squamous Epithelial / LPF 0-5 (*)    All other components within normal limits  RPR  HIV ANTIBODY (ROUTINE TESTING)  GC/CHLAMYDIA PROBE AMP ([place]    EKG  EKG Interpretation None       Radiology No results found.  Procedures Procedures (including critical care  time)  Medications Ordered in ED Medications  cefTRIAXone (ROCEPHIN) injection 250 mg (250 mg Intramuscular Given 06/04/17 1117)  azithromycin (ZITHROMAX) tablet 1,000 mg (1,000 mg Oral Given 06/04/17 1114)  metroNIDAZOLE (FLAGYL) tablet 2,000 mg (2,000 mg Oral Given 06/04/17 1114)  ondansetron (ZOFRAN-ODT) disintegrating tablet 4 mg (4 mg Oral Given 06/04/17 1114)     Initial Impression / Assessment and Plan / ED Course  I have reviewed the triage vital signs and the nursing notes.  Pertinent labs & imaging results that were available during my care of the patient were reviewed by me and considered in my medical decision making (see chart for details).  Patient is afebrile without abdominal tenderness, abdominal pain or painful bowel movements to indicate prostatitis.  No tenderness to palpation of the testes or epididymis to suggest orchitis or epididymitis.  STD cultures obtained including HIV, syphilis, gonorrhea and chlamydia. Patient to be discharged with instructions to follow up with health department. Discussed importance of using protection when sexually active. Pt understands that they have GC/Chlamydia cultures pending and that they will need to inform all sexual partners if results return positive. Patient has been treated prophylactically with azithromycin and Rocephin, pts partner is also being seen in the ED and was positive for trichomonas so pt was treated for this as well.    Final Clinical Impressions(s) / ED Diagnoses   Final diagnoses:  STD exposure  Penile discharge    ED Discharge Orders    None       Dartha LodgeFord, Iliani Vejar N, [place] JerseyPA-C 06/04/17 1907    Ward, Layla MawKristen N, DO 06/10/17 2303

## 2017-06-04 NOTE — Discharge Instructions (Addendum)
You were treated today for gonorrhea and chlamydia, as well as trichomonas, you have STI testing pending you'll be called in 2-3 days if any of these results are positive, if so you can receive treatment for these at the health Department. If any results come back positive please let any partner you've had in the past year know. Please use condoms for protection. If you develop fevers, chills, abdominal pain, rectal pain or pain with defecation return to the ED or primary care provider for reevaluation.

## 2017-06-05 LAB — HIV ANTIBODY (ROUTINE TESTING W REFLEX): HIV Screen 4th Generation wRfx: NONREACTIVE

## 2017-06-05 LAB — GC/CHLAMYDIA PROBE AMP (~~LOC~~) NOT AT ARMC
CHLAMYDIA, DNA PROBE: NEGATIVE
Neisseria Gonorrhea: POSITIVE — AB

## 2017-06-05 LAB — RPR: RPR: NONREACTIVE

## 2017-07-17 ENCOUNTER — Encounter (HOSPITAL_COMMUNITY): Payer: Self-pay | Admitting: Emergency Medicine

## 2017-07-17 ENCOUNTER — Ambulatory Visit (HOSPITAL_COMMUNITY)
Admission: EM | Admit: 2017-07-17 | Discharge: 2017-07-17 | Disposition: A | Discharge status: Home / Self Care | Payer: Self-pay | Attending: Family Medicine | Admitting: Family Medicine

## 2017-07-17 DIAGNOSIS — Z87891 Personal history of nicotine dependence: Secondary | ICD-10-CM | POA: Insufficient documentation

## 2017-07-17 DIAGNOSIS — J029 Acute pharyngitis, unspecified: Secondary | ICD-10-CM | POA: Insufficient documentation

## 2017-07-17 LAB — POCT RAPID STREP A: STREPTOCOCCUS, GROUP A SCREEN (DIRECT): NEGATIVE

## 2017-07-17 MED ORDER — AMOXICILLIN 875 MG PO TABS: 875.0000 mg | ORAL_TABLET | Freq: Two times a day (BID) | ORAL | 0 refills | Status: AC

## 2017-07-17 NOTE — ED Triage Notes (Signed)
PT C/O: sore throat onset 6 days associated w/dysphagia,   DENIES: fevers  TAKING MEDS: none   A&O x4... NAD... Ambulatory

## 2017-07-17 NOTE — Discharge Instructions (Signed)
We have sent a throat culture and will notify you if any changes to your treatment need to be made. You may use over the counter ibuprofen or acetaminophen as needed.

## 2017-07-18 NOTE — ED Provider Notes (Signed)
  [facility]   [id number] 07/17/17 Arrival Time: 1751  ASSESSMENT & PLAN:  1. Sore throat     Meds ordered this encounter  Medications  . amoxicillin (AMOXIL) 875 MG tablet    Sig: Take 1 tablet (875 mg total) by mouth 2 (two) times daily for 10 days.    Dispense:  20 tablet    Refill:  0    Results for orders placed or performed during the hospital encounter of 07/17/17  POCT rapid strep A [facility])  Result Value Ref Range   Streptococcus, Group A Screen (Direct) NEGATIVE NEGATIVE   Labs Reviewed  CULTURE, GROUP A STREP [facility])  POCT RAPID STREP A   Given exam will treat empirically. OTC analgesics and throat care as needed  Instructed to finish full 10 day course of antibiotics. Will follow up if not showing significant improvement over the next 24-48 hours.  Reviewed expectations re: course of current medical issues. Questions answered. Outlined signs and symptoms indicating need for more acute intervention. Patient verbalized understanding. After Visit Summary given.   SUBJECTIVE:  Charles Rollins is a 32 y.o. male who reports a sore throat. Describes as sharp pain with swallowing. Onset abrupt beginning a few days ago. No respiratory symptoms. Normal PO intake but reports discomfort with swallowing. Fever reported: unsure; "maybe chilled a little". No associated n/v/abdominal symptoms. Sick contacts: None.  OTC treatment: None.  ROS: As per HPI.   OBJECTIVE:  Vitals:   07/17/17 1818  BP: 130/84  Pulse: 74  Resp: 20  Temp: 98.5 F (36.9 C)  TempSrc: Oral  SpO2: 100%    General appearance: alert; no distress HEENT: throat with tonsillar hypertrophy, marked erythema and exudates present Neck: supple with FROM; bilateral cervical LAD, tender Lungs: clear to auscultation bilaterally Skin: reveals no rash; warm and dry Psychological: alert and cooperative; normal mood and affect  No Known Allergies   Social History    Socioeconomic History  . Marital status: Single    Spouse name: Not on file  . Number of children: Not on file  . Years of education: Not on file  . Highest education level: Not on file  Social Needs  . Financial resource strain: Not on file  . Food insecurity - worry: Not on file  . Food insecurity - inability: Not on file  . Transportation needs - medical: Not on file  . Transportation needs - non-medical: Not on file  Occupational History  . Not on file  Tobacco Use  . Smoking status: Former Games developermoker  . Smokeless tobacco: Never Used  Substance and Sexual Activity  . Alcohol use: Yes  . Drug use: Yes    Types: Marijuana  . Sexual activity: Yes  Other Topics Concern  . Not on file  Social History Narrative  . Not on file         Mardella LaymanHagler, Taniya Dasher, MD 07/18/17 [phone]

## 2017-07-19 LAB — CULTURE, GROUP A STREP (THRC)

## 2017-08-05 ENCOUNTER — Emergency Department (HOSPITAL_COMMUNITY)
Admission: EM | Admit: 2017-08-05 | Discharge: 2017-08-05 | Disposition: A | Discharge status: Home / Self Care | Payer: Self-pay | Attending: Emergency Medicine | Admitting: Emergency Medicine

## 2017-08-05 ENCOUNTER — Encounter (HOSPITAL_COMMUNITY): Payer: Self-pay

## 2017-08-05 ENCOUNTER — Emergency Department (HOSPITAL_COMMUNITY): Payer: Self-pay

## 2017-08-05 ENCOUNTER — Other Ambulatory Visit: Payer: Self-pay

## 2017-08-05 DIAGNOSIS — Z87891 Personal history of nicotine dependence: Secondary | ICD-10-CM | POA: Insufficient documentation

## 2017-08-05 DIAGNOSIS — Z79899 Other long term (current) drug therapy: Secondary | ICD-10-CM | POA: Insufficient documentation

## 2017-08-05 DIAGNOSIS — J039 Acute tonsillitis, unspecified: Secondary | ICD-10-CM | POA: Insufficient documentation

## 2017-08-05 LAB — I-STAT CREATININE, ED: CREATININE: 1 mg/dL (ref 0.61–1.24)

## 2017-08-05 LAB — RAPID STREP SCREEN (MED CTR MEBANE ONLY): Streptococcus, Group A Screen (Direct): NEGATIVE

## 2017-08-05 MED ORDER — IBUPROFEN 800 MG PO TABS: 800.0000 mg | ORAL_TABLET | Freq: Three times a day (TID) | ORAL | 0 refills | Status: DC | PRN

## 2017-08-05 MED ORDER — CLINDAMYCIN HCL 150 MG PO CAPS: 300.0000 mg | ORAL_CAPSULE | Freq: Four times a day (QID) | ORAL | 0 refills | Status: DC

## 2017-08-05 MED ORDER — DEXAMETHASONE SODIUM PHOSPHATE 10 MG/ML IJ SOLN
10.0000 mg | Freq: Once | INTRAMUSCULAR | Status: AC
  Administered 2017-08-05: 10 mg via INTRAMUSCULAR
  Filled 2017-08-05: qty 1

## 2017-08-05 MED ORDER — IOPAMIDOL (ISOVUE-300) INJECTION 61%
INTRAVENOUS | Status: AC
  Administered 2017-08-05: 75 mL
  Filled 2017-08-05: qty 75

## 2017-08-05 NOTE — Discharge Instructions (Signed)
It was my pleasure taking care of you today!   Please take all of your antibiotics until finished!  Ibuprofen as needed for pain.   If your symptoms are not improving in a few days, please call the ENT (ear, nose and throat) doctor listed to schedule a follow up appointment.   Return to ER for difficulty breathing, can't eat or drink, new or worsening symptoms develop, any additional concerns.

## 2017-08-05 NOTE — ED Triage Notes (Signed)
Patient complains of sore throat ongoing for greater than 1 week. [facility] and took antibiotics as prescribed. Alert and oriented, NAD

## 2017-08-05 NOTE — ED Provider Notes (Signed)
[facility] EMERGENCY DEPARTMENT Provider Note   CSN: [id number] Arrival date & time: 08/05/17  1122     History   Chief Complaint Chief Complaint  Patient presents with  . Sore Throat    HPI Charles Rollins is a 32 y.o. male.  The history is provided by the patient and medical records. No language interpreter was used.  Sore Throat  Pertinent negatives include no chest pain, no abdominal pain and no shortness of breath.   Charles Rollins is a 32 y.o. male with no pertinent PMH who presents to the Emergency Department complaining of persistent sore throat the last 4 weeks.  Patient states he was seen at urgent care a few days after symptom onset where he tested negative for strep, however was started on amoxicillin.  He took medications each day as directed until completion with no improvement in his symptoms.  He also has been taking Tylenol as needed for pain with some relief.  He does not feel like he is getting any better and last night felt as if his symptoms may have gotten worse.  He has been able to eat soft foods and drink, however much more painful with swallowing yesterday and today than in the past.  Felt as if he may have had a fever last night, but did not check temperature.  No shortness of breath.   History reviewed. No pertinent past medical history.  There are no active problems to display for this patient.   History reviewed. No pertinent surgical history.     Home Medications    Prior to Admission medications   Medication Sig Start Date End Date Taking? Authorizing Provider  acetaminophen (TYLENOL) 500 MG tablet Take 500 mg by mouth every 6 (six) hours as needed for mild pain.    [provider]  HYDROcodone-acetaminophen (NORCO/VICODIN) 5-325 MG tablet Take 1 tablet by mouth 4 (four) times daily as needed. 05/11/16   Isa Rankin, MD  predniSONE (DELTASONE) 50 MG tablet Take 1 tablet (50 mg total) by mouth daily.  05/11/16   Isa Rankin, MD    Family History No family history on file.  Social History Social History   Tobacco Use  . Smoking status: Former Games developer  . Smokeless tobacco: Never Used  Substance Use Topics  . Alcohol use: Yes  . Drug use: Yes    Types: Marijuana     Allergies   Patient has no known allergies.   Review of Systems Review of Systems  Constitutional: Positive for fever (Subjective). Negative for chills.  HENT: Positive for sore throat and voice change (Hoarseness). Negative for congestion and trouble swallowing.   Respiratory: Positive for cough. Negative for shortness of breath and wheezing.   Cardiovascular: Negative for chest pain.  Gastrointestinal: Negative for abdominal pain, nausea and vomiting.     Physical Exam Updated Vital Signs BP 121/75 (BP Location: Right Arm)   Pulse 73   Temp 98.2 F (36.8 C) (Oral)   Resp 16   SpO2 99%   Physical Exam  Constitutional: He appears well-developed and well-nourished. No distress.  HENT:  Head: Normocephalic and atraumatic.  OP with significant tonsillar hypertrophy and erythema. No exudates. Tolerating secretions.   Neck: Neck supple.  Bilateral tender anterior cervical lymphadenopathy.   Cardiovascular: Normal rate, regular rhythm and normal heart sounds.  No murmur heard. Pulmonary/Chest: Effort normal and breath sounds normal. No respiratory distress. He has no wheezes. He has no rales.  Musculoskeletal:  Normal range of motion.  Neurological: He is alert.  Skin: Skin is warm and dry.  Nursing note and vitals reviewed.    ED Treatments / Results  Labs (all labs ordered are listed, but only abnormal results are displayed) Labs Reviewed - No data to display  EKG  EKG Interpretation None       Radiology No results found.  Procedures Procedures (including critical care time)  Medications Ordered in ED Medications - No data to display   Initial Impression / Assessment and  Plan / ED Course  I have reviewed the triage vital signs and the nursing notes.  Pertinent labs & imaging results that were available during my care of the patient were reviewed by me and considered in my medical decision making (see chart for details).    Odette FractionMatthews A Musil is a 32 y.o. male who presents to ED for persistent sore throat over the last 4 weeks which acutely worsened yesterday.  He has already completed course of amoxicillin with no improvement.  Rapid strep negative.  Significant amount of tonsillar hypertrophy on exam, fortunately he is tolerating secretions well and tolerating p.o. Decadron given.  CT neck with pharyngitis/tonsillitis without any PTA or signs of neck space infection.  Will treat with clindamycin and have patient follow-up with ENT if no improvement.  Reasons to return to ER were discussed and all questions answered.   Final Clinical Impressions(s) / ED Diagnoses   Final diagnoses:  None    ED Discharge Orders    None       Mialani Reicks, Chase PicketJaime Pilcher, PA-C 08/05/17 2054    Azalia Bilisampos, Kevin, MD 08/06/17 [phone]

## 2017-08-07 LAB — CULTURE, GROUP A STREP (THRC)

## 2019-02-08 ENCOUNTER — Emergency Department (HOSPITAL_COMMUNITY)
Admission: EM | Admit: 2019-02-08 | Discharge: 2019-02-08 | Disposition: A | Discharge status: Home / Self Care | Payer: Self-pay | Attending: Emergency Medicine | Admitting: Emergency Medicine

## 2019-02-08 ENCOUNTER — Other Ambulatory Visit: Payer: Self-pay

## 2019-02-08 ENCOUNTER — Encounter (HOSPITAL_COMMUNITY): Payer: Self-pay | Admitting: Emergency Medicine

## 2019-02-08 ENCOUNTER — Emergency Department (HOSPITAL_COMMUNITY): Payer: Self-pay

## 2019-02-08 DIAGNOSIS — Z20828 Contact with and (suspected) exposure to other viral communicable diseases: Secondary | ICD-10-CM | POA: Insufficient documentation

## 2019-02-08 DIAGNOSIS — R0789 Other chest pain: Secondary | ICD-10-CM | POA: Insufficient documentation

## 2019-02-08 DIAGNOSIS — Z87891 Personal history of nicotine dependence: Secondary | ICD-10-CM | POA: Insufficient documentation

## 2019-02-08 LAB — BASIC METABOLIC PANEL
Anion gap: 11 (ref 5–15)
BUN: 9 mg/dL (ref 6–20)
CO2: 23 mmol/L (ref 22–32)
Calcium: 9.1 mg/dL (ref 8.9–10.3)
Chloride: 102 mmol/L (ref 98–111)
Creatinine, Ser: 0.98 mg/dL (ref 0.61–1.24)
GFR calc Af Amer: >60 mL/min (ref >60)
GFR calc non Af Amer: >60 mL/min (ref >60)
Glucose, Bld: 100 mg/dL — ABNORMAL HIGH (ref 70–99)
Potassium: 3.5 mmol/L (ref 3.5–5.1)
Sodium: 136 mmol/L (ref 135–145)

## 2019-02-08 LAB — CBC
HCT: 46 % (ref 39.0–52.0)
Hemoglobin: 15.8 g/dL (ref 13.0–17.0)
MCH: 32.1 pg (ref 26.0–34.0)
MCHC: 34.3 g/dL (ref 30.0–36.0)
MCV: 93.5 fL (ref 80.0–100.0)
Platelets: 213 10*3/uL (ref 150–400)
RBC: 4.92 MIL/uL (ref 4.22–5.81)
RDW: 12.4 % (ref 11.5–15.5)
WBC: 5.2 10*3/uL (ref 4.0–10.5)
nRBC: 0 % (ref 0.0–0.2)

## 2019-02-08 LAB — TROPONIN I (HIGH SENSITIVITY)
Troponin I (High Sensitivity): 4 ng/L (ref <18)
Troponin I (High Sensitivity): 4 ng/L (ref <18)

## 2019-02-08 MED ORDER — BENZONATATE 100 MG PO CAPS: 100.0000 mg | ORAL_CAPSULE | Freq: Three times a day (TID) | ORAL | 0 refills | Status: AC

## 2019-02-08 MED ORDER — SODIUM CHLORIDE 0.9% FLUSH: 3.0000 mL | Freq: Once | INTRAVENOUS | Status: DC

## 2019-02-08 NOTE — ED Provider Notes (Signed)
[facility] Provider Note   CSN: [id number] Arrival date & time: 02/08/19  1158    History   Chief Complaint Chief Complaint  Patient presents with   Shortness of Breath   Chest Pain    HPI Charles Rollins is a 33 y.o. male presents today for chest pain and shortness of breath.   Patient reports that over the past 2 weeks he has had a mild cough, he feels this is secondary to allergies in his home he reports that his home is very dusty has not changed the filter in quite some time.  He reports an occasional nonproductive cough.  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  Patient denies any recurrence of chest pain.  Patient reports he only felt short of breath briefly in the middle of his coughing fit and this has not reoccurred.  Patient reports that his work has requested that he come to the emergency department for evaluation prior to return.  Patient denies fever/chills, headache/vision changes, neck pain/stiffness, hemoptysis, pleurisy, shortness of breath or chest pain today, abdominal pain, nausea/vomiting, diarrhea, diaphoresis, extremity pain/swelling, weakness, history of blood clot, malignancy, exogenous hormone use, recent surgery/injury, recent immobilization or any additional concerns today.    HPI  History reviewed. No pertinent past medical history.  There are no active problems to display for this patient.   History reviewed. No pertinent surgical history.      Home Medications    Prior to Admission medications   Medication Sig Start Date End Date Taking? Authorizing Provider  benzonatate (TESSALON) 100 MG capsule Take 1 capsule (100 mg total) by mouth every 8 (eight) hours. 02/08/19   Bill SalinasMorelli, Rozetta Stumpp A, PA-C     Family History No family history on file.  Social History Social History   Tobacco Use   Smoking status: Former Smoker   Smokeless tobacco: Never Used  Substance Use Topics   Alcohol use: Yes   Drug use: Yes    Types: Marijuana     Allergies   Patient has no known allergies.   Review of Systems Review of Systems Ten systems are reviewed and are negative for acute change except as noted in the HPI  Physical Exam Updated Vital Signs BP (!) 130/91 (BP Location: Right Arm)    Pulse 86    Temp 98.1 F (36.7 C) (Oral)    Resp 16    Ht 6' (1.829 m)    Wt 74.8 kg    SpO2 100%    BMI 22.38 kg/m   Physical Exam Constitutional:      General: He is not in acute distress.    Appearance: Normal appearance. He is well-developed. He is not ill-appearing or diaphoretic.  HENT:     Head: Normocephalic and atraumatic.     Right Ear: External ear normal.     Left Ear: External ear normal.     Nose: Nose normal.  Eyes:     General: Vision grossly intact. Gaze aligned appropriately.     Pupils: Pupils are equal, round, and reactive to light.  Neck:     Musculoskeletal: Normal range of motion.     Trachea: Trachea and phonation normal. No tracheal deviation.  Cardiovascular:     Rate and Rhythm:  Normal rate and regular rhythm.     Heart sounds: Normal heart sounds.  Pulmonary:     Effort: Pulmonary effort is normal. No accessory muscle usage or respiratory distress.     Breath sounds: Normal breath sounds. No decreased breath sounds or rhonchi.  Chest:     Chest wall: No deformity, tenderness or crepitus.  Abdominal:     General: There is no distension.     Palpations: Abdomen is soft.     Tenderness: There is no abdominal tenderness. There is no guarding or rebound.  Musculoskeletal: Normal range of motion.     Right lower leg: He exhibits no tenderness. No edema.     Left lower leg: He exhibits no tenderness. No edema.  Skin:    General: Skin is warm and dry.   Neurological:     Mental Status: He is alert.     GCS: GCS eye subscore is 4. GCS verbal subscore is 5. GCS motor subscore is 6.     Comments: Speech is clear and goal oriented, follows commands Major Cranial nerves without deficit, no facial droop Moves extremities without ataxia, coordination intact  Psychiatric:        Behavior: Behavior normal.    ED Treatments / Results  Labs (all labs ordered are listed, but only abnormal results are displayed) Labs Reviewed  BASIC METABOLIC PANEL - Abnormal; Notable for the following components:      Result Value   Glucose, Bld 100 (*)    All other components within normal limits  NOVEL CORONAVIRUS, NAA (HOSPITAL ORDER, SEND-OUT TO REF LAB)  CBC  TROPONIN I (HIGH SENSITIVITY)  TROPONIN I (HIGH SENSITIVITY)    EKG EKG Interpretation  Date/Time:  Sunday February 08 2019 12:03:38 EDT Ventricular Rate:  72 PR Interval:  148 QRS Duration: 96 QT Interval:  356 QTC Calculation: 389 R Axis:   72 Text Interpretation:  Normal sinus rhythm Nonspecific T wave abnormality Abnormal ECG No previous ECGs available Confirmed by Alvira MondaySchlossman, Erin ([id number]) on 02/08/2019 2:26:22 PM   Radiology Dg Chest 2 View  Result Date: 02/08/2019 CLINICAL DATA:  Chest pain and shortness of breath EXAM: CHEST - 2 VIEW COMPARISON:  07/06/2015 FINDINGS: Normal heart size and mediastinal contours. No acute infiltrate or edema. Artifact from EKG leads. No effusion or pneumothorax. No acute osseous findings. IMPRESSION: Negative chest. Electronically Signed   By: Marnee SpringJonathon  Watts M.D.   On: 02/08/2019 14:01    Procedures Procedures (including critical care time)  Medications Ordered in ED Medications  sodium chloride flush (NS) 0.9 % injection 3 mL (has no administration in time range)     Initial Impression / Assessment and Plan / ED Course  I have reviewed the triage vital signs and the nursing notes.  Pertinent labs & imaging results that were available during  my care of the patient were reviewed by me and considered in my medical decision making (see chart for details).    33 year old otherwise healthy male presents today for a 2-week history of intermittent nonproductive cough.  One episode of brief shortness of breath and chest pain during a coughing exacerbation while at work 2 days ago.  He has been feeling well since that time without recurrence of chest pain or shortness of breath.  He attributes his symptoms to allergies.  Denies any recent infectious-like symptoms.  Patient required to come to the emergency department by his employer prior to return to work.  Initial evaluation patient overall well-appearing and in no  acute distress.  Heart regular rate and rhythm without murmur rub or gallop, lungs clear to auscultation bilaterally, abdomen soft nontender without peritoneal signs, extremities neurovascularly intact x4, no sign of DVT.  Patient is a low risk by Wells criteria and PERC negative.  Afebrile without tachycardia, hypotension or hypoxia on room air. - CBC within normal limits BMP nonacute Initial high-sensitivity troponin: 4 Delta high-sensitivity troponin: 4 Chest x-ray:  IMPRESSION:  Negative chest.   EKG:  Normal sinus rhythm Nonspecific T wave abnormality Abnormal ECG No previous ECGs available Confirmed by Gareth Morgan [phone]) on 02/08/2019 2:26:22 PM - With reassuring work-up as above do not suspect ACS as etiology of patient's improved symptoms 2 days ago without recurrence, heart score less than 4.  Low risk by Wells criteria PERC negative do not suspect pulmonary embolism, reassuring physical examination and history not consistent with dissection.  Do not suspect other acute cardiopulmonary etiology of patient's symptoms today.  Patient requesting COVID-19 testing, no indication for admission we will send out COVID-19 testing today patient aware to self quarantine until results return and symptom-free for 7 days if  negative in 14 days if positive.  At this time there does not appear to be any evidence of an acute emergency medical condition and the patient appears stable for discharge with appropriate outpatient follow up. Diagnosis was discussed with patient who verbalizes understanding of care plan and is agreeable to discharge. I have discussed return precautions with patient who verbalizes understanding of return precautions. Patient encouraged to follow-up with their PCP. All questions answered.  Patient's case discussed with Dr. Billy Fischer who agrees with plan to discharge with follow-up.   Charles Rollins was evaluated in Emergency Department on 02/08/2019 for the symptoms described in the history of present illness. He was evaluated in the context of the global COVID-19 pandemic, which necessitated consideration that the patient might be at risk for infection with the SARS-CoV-2 virus that causes COVID-19. Institutional protocols and algorithms that pertain to the evaluation of patients at risk for COVID-19 are in a state of rapid change based on information released by regulatory bodies including the CDC and federal and state organizations. These policies and algorithms were followed during the patient's care in the ED.   Note: Portions of this report may have been transcribed using voice recognition software. Every effort was made to ensure accuracy; however, inadvertent computerized transcription errors may still be present. Final Clinical Impressions(s) / ED Diagnoses   Final diagnoses:  Atypical chest pain    ED Discharge Orders         Ordered    benzonatate (TESSALON) 100 MG capsule  Every 8 hours     02/08/19 1554           Gari Crown 02/08/19 1625    Gareth Morgan, MD 02/10/19 [phone]

## 2019-02-08 NOTE — Discharge Instructions (Signed)
You have been diagnosed today with Atypical Chest Pain  At this time there does not appear to be the presence of an emergent medical condition, however there is always the potential for conditions to change. Please read and follow the below instructions.  Please return to the Emergency Department immediately for any new or worsening symptoms. Please be sure to follow up with your Primary Care Provider within one week regarding your visit today; please call their office to schedule an appointment even if you are feeling better for a follow-up visit. You may use the Tessalon as prescribed to help with your cough. You have been tested for COVID-19 virus today results will be available in the next 3-4 days you may check your MyChart account for test results.  Please continue quarantine until your results available, continue quarantine for 7 days symptom-free for negative test and 14 days symptom-free for positive tests.  Get help right away if: Your chest pain gets worse. You have a cough that gets worse, or you cough up blood. You have severe pain in your abdomen. You faint. You have sudden, unexplained chest discomfort. You have sudden, unexplained discomfort in your arms, back, neck, or jaw. You have shortness of breath at any time. You suddenly start to sweat, or your skin gets clammy. You feel nausea or you vomit. You suddenly feel lightheaded or dizzy. You have severe weakness, or unexplained weakness or fatigue. Your heart begins to beat quickly, or it feels like it is skipping beats. Any new/concerning or worsening symptoms  Please read the additional information packets attached to your discharge summary.  Do not take your medicine if  develop an itchy rash, swelling in your mouth or lips, or difficulty breathing; call 911 and seek immediate emergency medical attention if this occurs.

## 2019-02-08 NOTE — ED Notes (Signed)
Patient verbalizes understanding of discharge instructions . Opportunity for questions and answers were provided . Armband removed by staff ,Pt discharged from ED. W/C  offered at D/C  and Declined W/C at D/C and was escorted to lobby by RN.  

## 2019-02-08 NOTE — ED Triage Notes (Signed)
Pt reports being at work on Friday and having chest pains with SOB. Pt's symptoms resolved and his job required him to come and get evaluated.

## 2019-02-09 LAB — NOVEL CORONAVIRUS, NAA (HOSP ORDER, SEND-OUT TO REF LAB; TAT 18-24 HRS): SARS-CoV-2, NAA: NOT DETECTED

## 2019-02-26 IMAGING — CT CT NECK W/ CM
5 of 6 series · 14 of 33 positions shown, 16 images · IV contrast (APPLIED)
Comparison: None.

CLINICAL DATA: Sore throat for 3 weeks. Difficulty swallowing at
times.

EXAM:
CT NECK WITH CONTRAST
TECHNIQUE: Multidetector CT imaging of the neck was performed using the
standard protocol following the bolus administration of intravenous
contrast.
CONTRAST:  75mL YQR2JS-HZZ IOPAMIDOL (YQR2JS-HZZ) INJECTION 61%

[Series 3: axial neck · axial · 0.52mm/px · z∈[-153,-71]mm · 2 of 125 slices shown]
[im 42/125  bone]
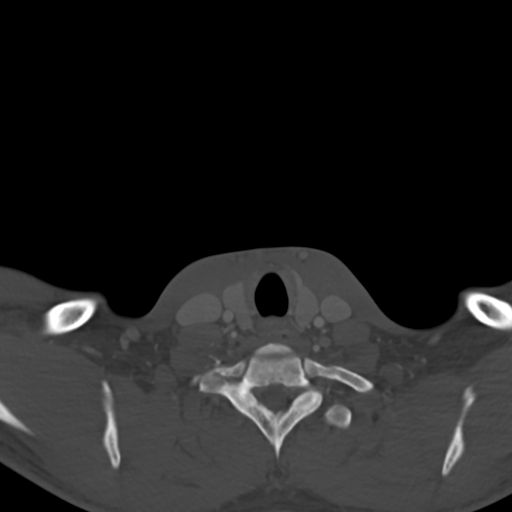
[im 83/125  bone]
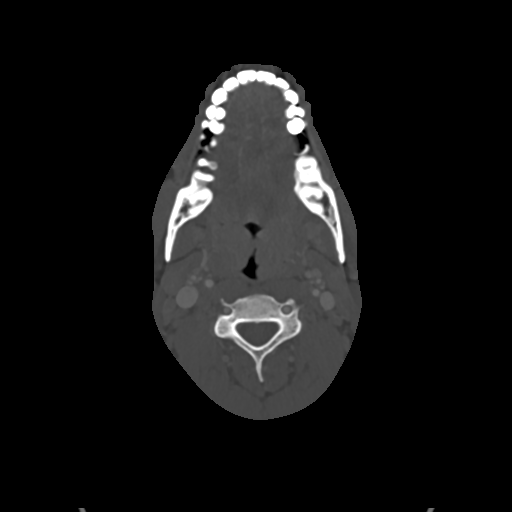

[Series 5: axial bone · axial · 0.52mm/px · z∈[-153,-71]mm · 2 of 125 slices shown]
[im 42/125  bone]
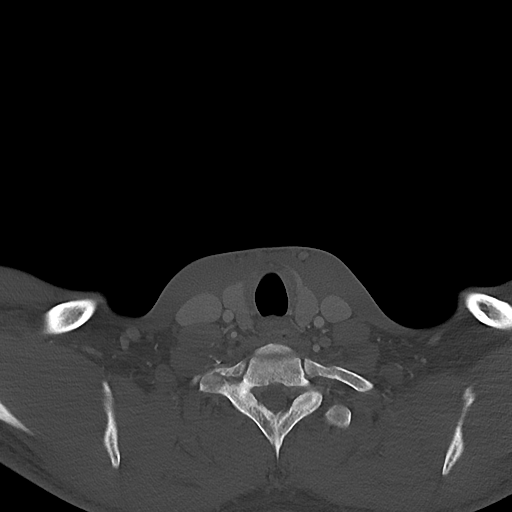
[im 83/125  bone]
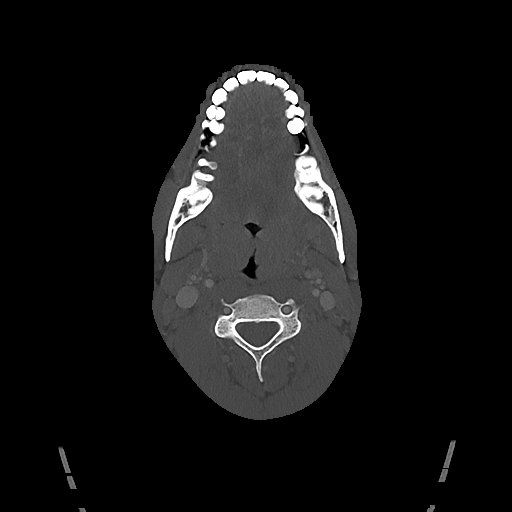

[Series 6: sag neck · sagittal · 0.52mm/px · 5 of 72 slices shown, 6 images]
[im 24/72  bone]
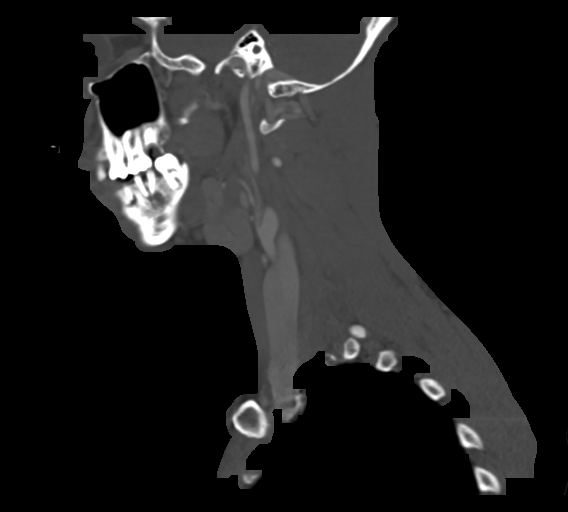
[im 30/72  bone]
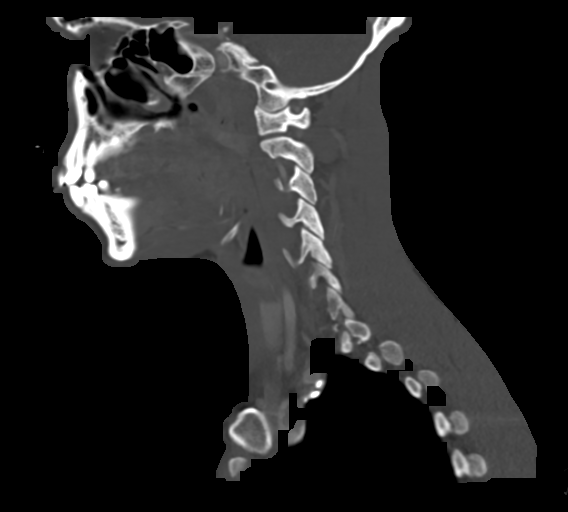
[im 36/72  soft-tissue]
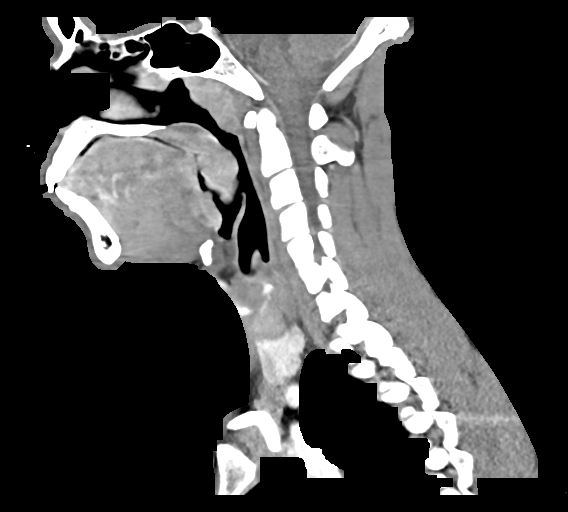
[im 36/72  bone]
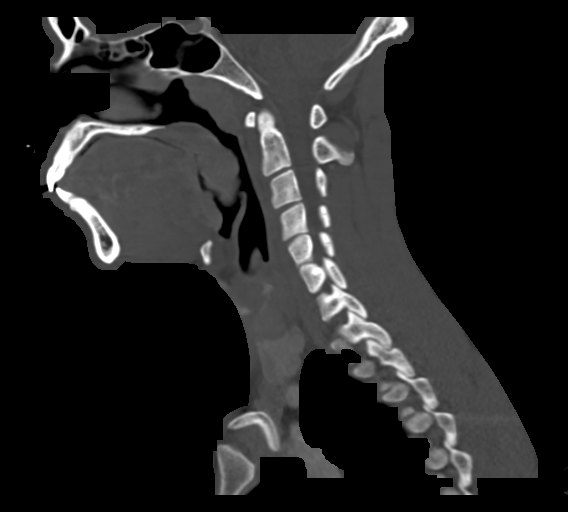
[im 42/72  bone]
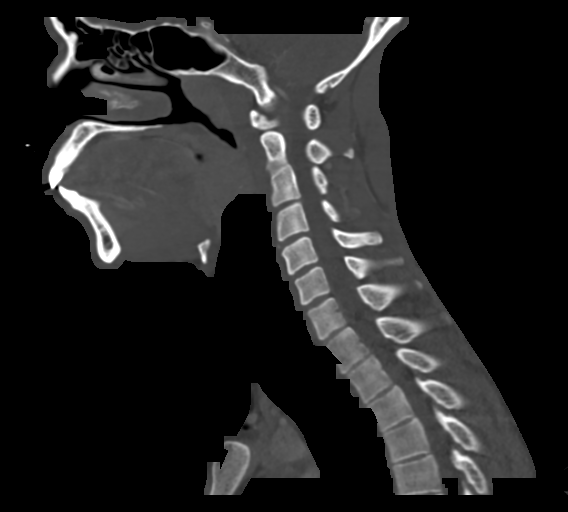
[im 48/72  bone]
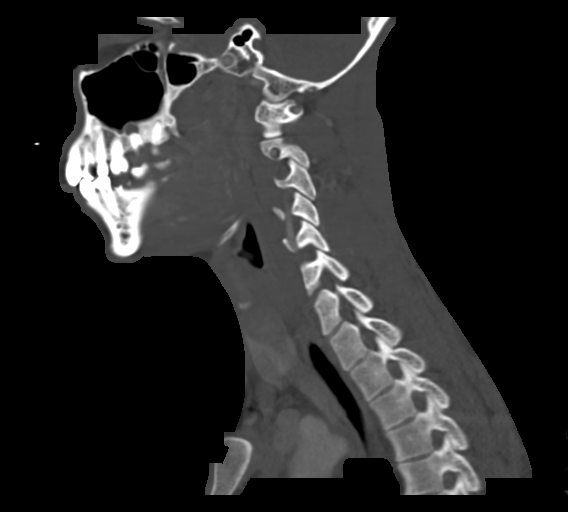

[Series 7: cor neck · coronal · 0.51mm/px · 3 of 101 slices shown]
[im 21/101  bone]
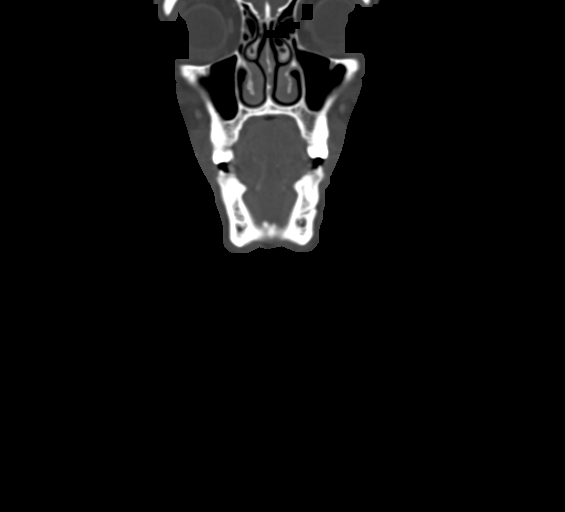
[im 41/101  bone]
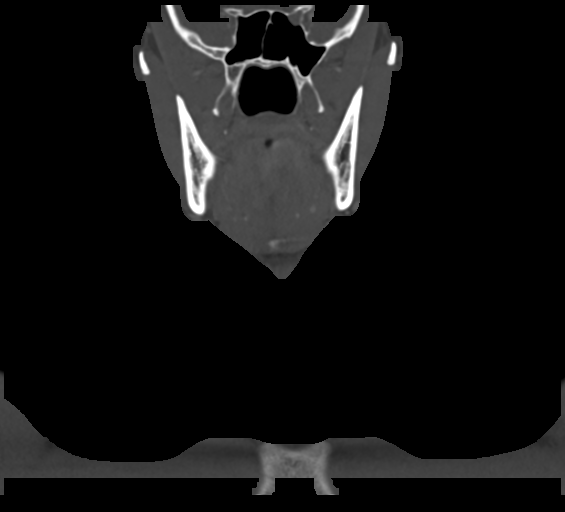
[im 61/101  bone]
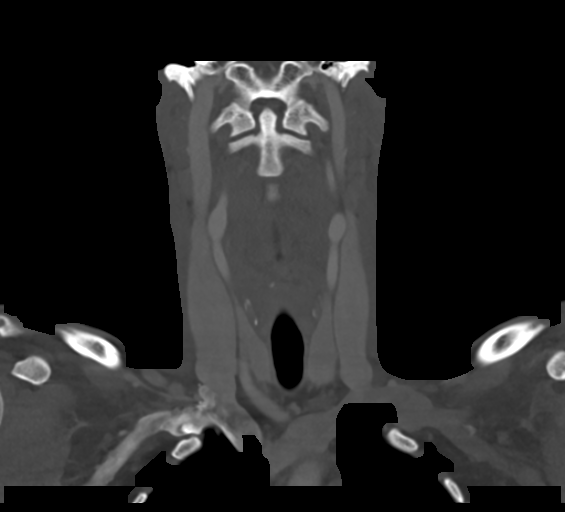

[Series 8: ax oropharynx · axial · 0.40mm/px · z∈[-169,-80]mm · 2 of 137 slices shown, 3 images]
[im 46/137  soft-tissue]
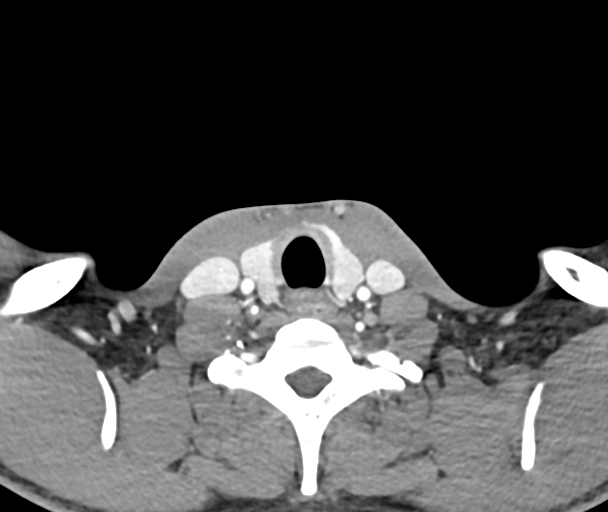
[im 46/137  bone]
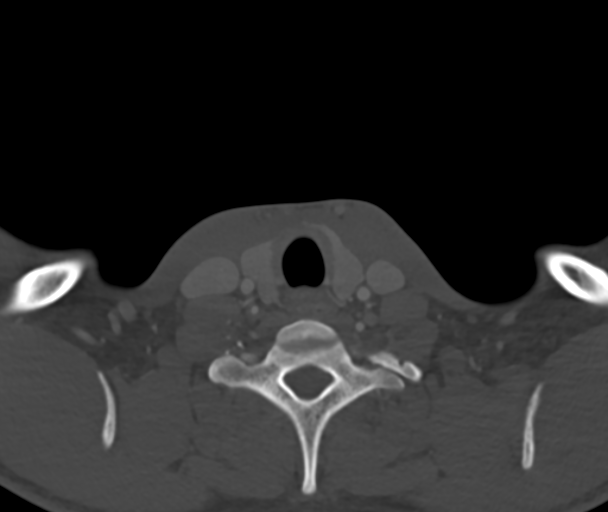
[im 91/137  bone]
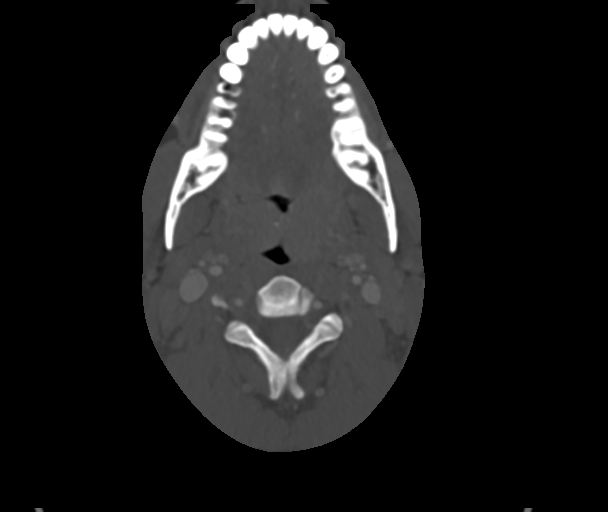

[14 of 33 positions shown; findings below may reference images not displayed]

FINDINGS: Pharynx and larynx: There is prominent tonsillar and nasopharyngeal
adenoidal enhancement consistent with pharyngitis/tonsillitis. The
LEFT tonsil is slightly larger, but there is no evidence for
tonsillar or peritonsillar abscess. Parapharyngeal fat is preserved.

Salivary glands: No inflammation, mass, or stone.

Thyroid: Normal.

Lymph nodes: Reactive adenopathy without pathologic features.

Vascular: Negative.

Limited intracranial: Negative.

Visualized orbits: Negative.

Mastoids and visualized paranasal sinuses: Clear.

Skeleton: No acute or aggressive process.

Upper chest: Negative.

Other: Poor dentition. BILATERAL dental caries and periapical
lucencies, but no evidence for odontogenic infection/abscess in face
or neck.
IMPRESSION: Pharyngitis/tonsillitis, without evidence for tonsillar or
peritonsillar abscess.

Poor dentition with BILATERAL dental caries and periapical
lucencies, but no evidence for odontogenic infection/abscess in face
or neck.

## 2020-08-31 IMAGING — DX CHEST - 2 VIEW
2 series · 2 of 2 positions shown · non-contrast
Comparison: 07/06/2015

CLINICAL DATA: Chest pain and shortness of breath

EXAM:
CHEST - 2 VIEW

[chest pa]
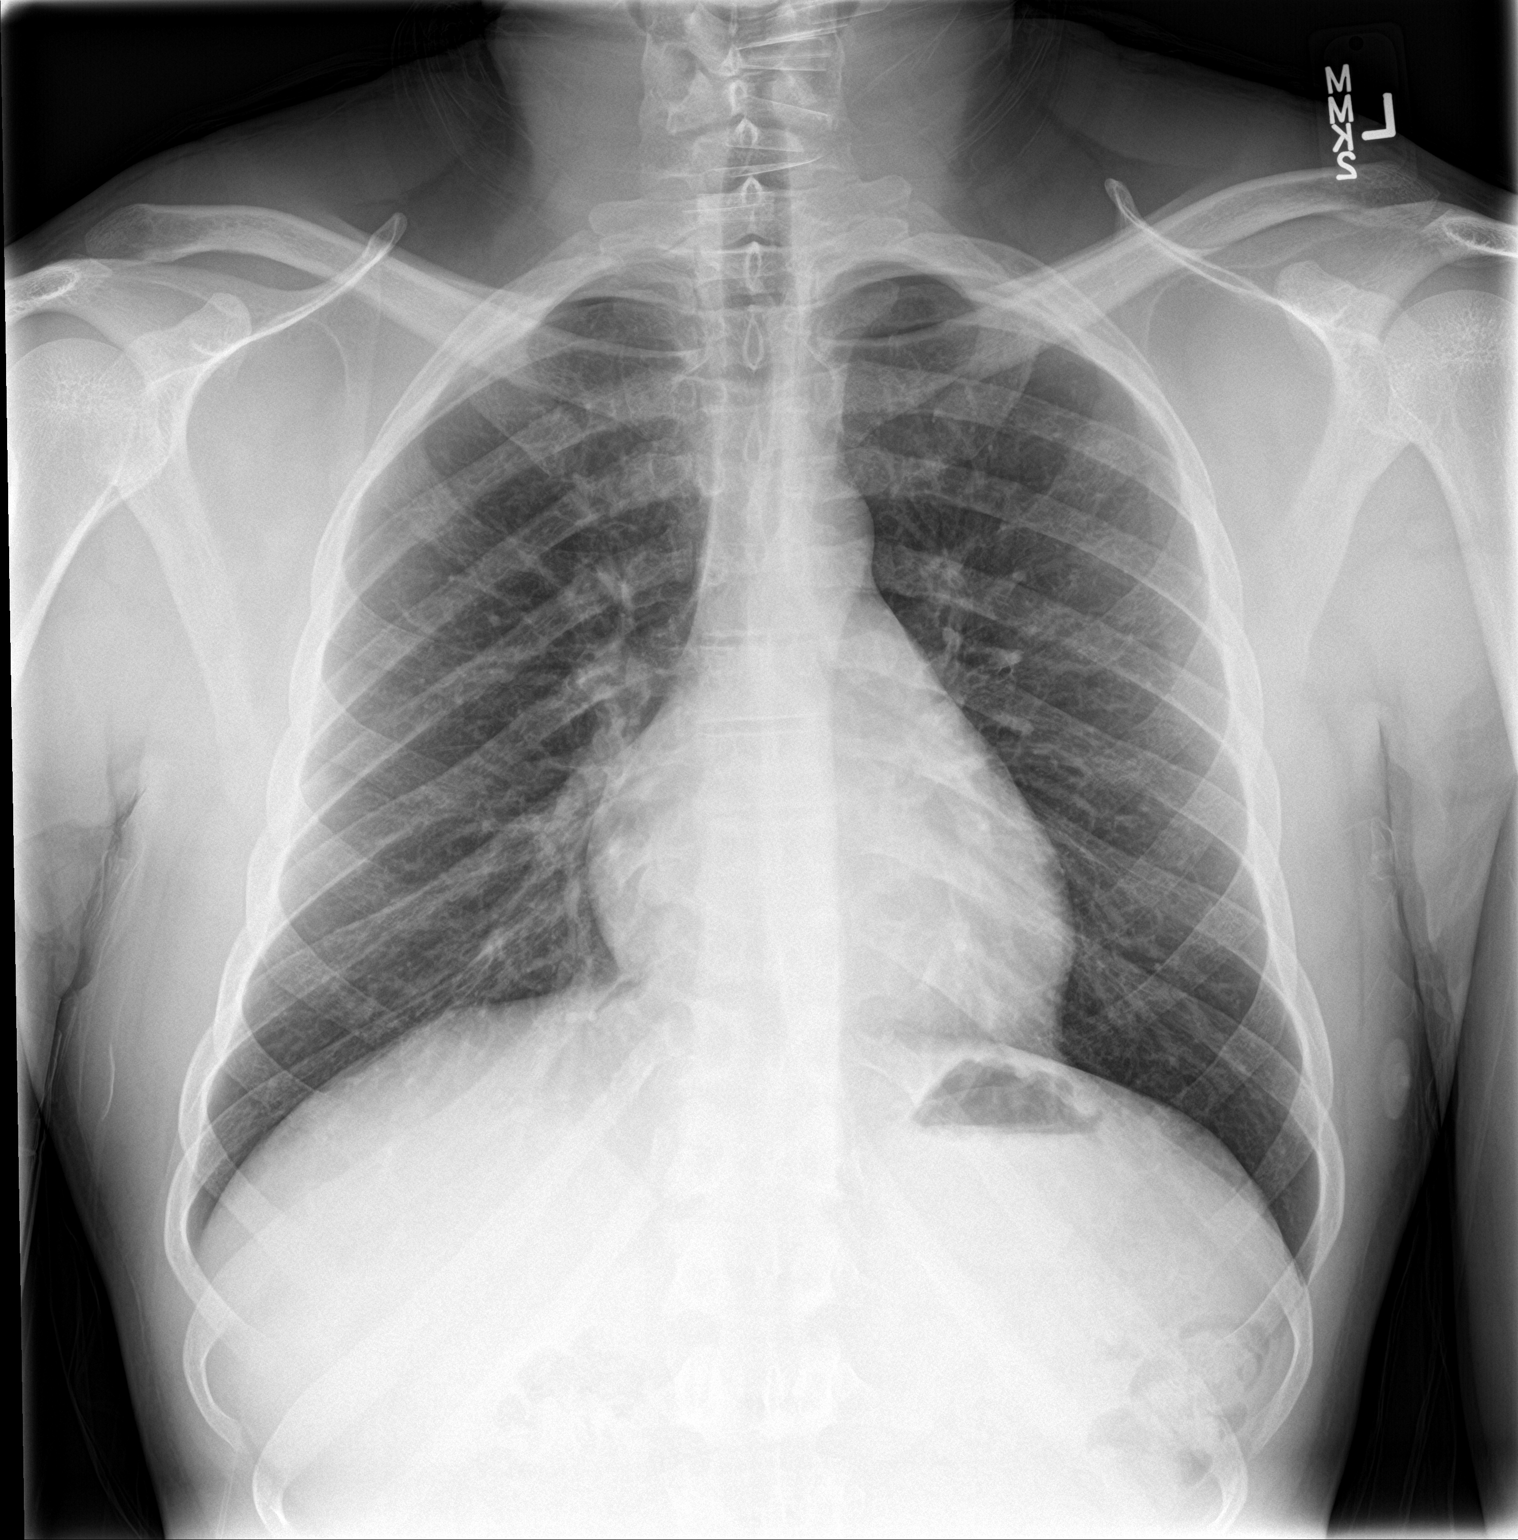

[chest lat]
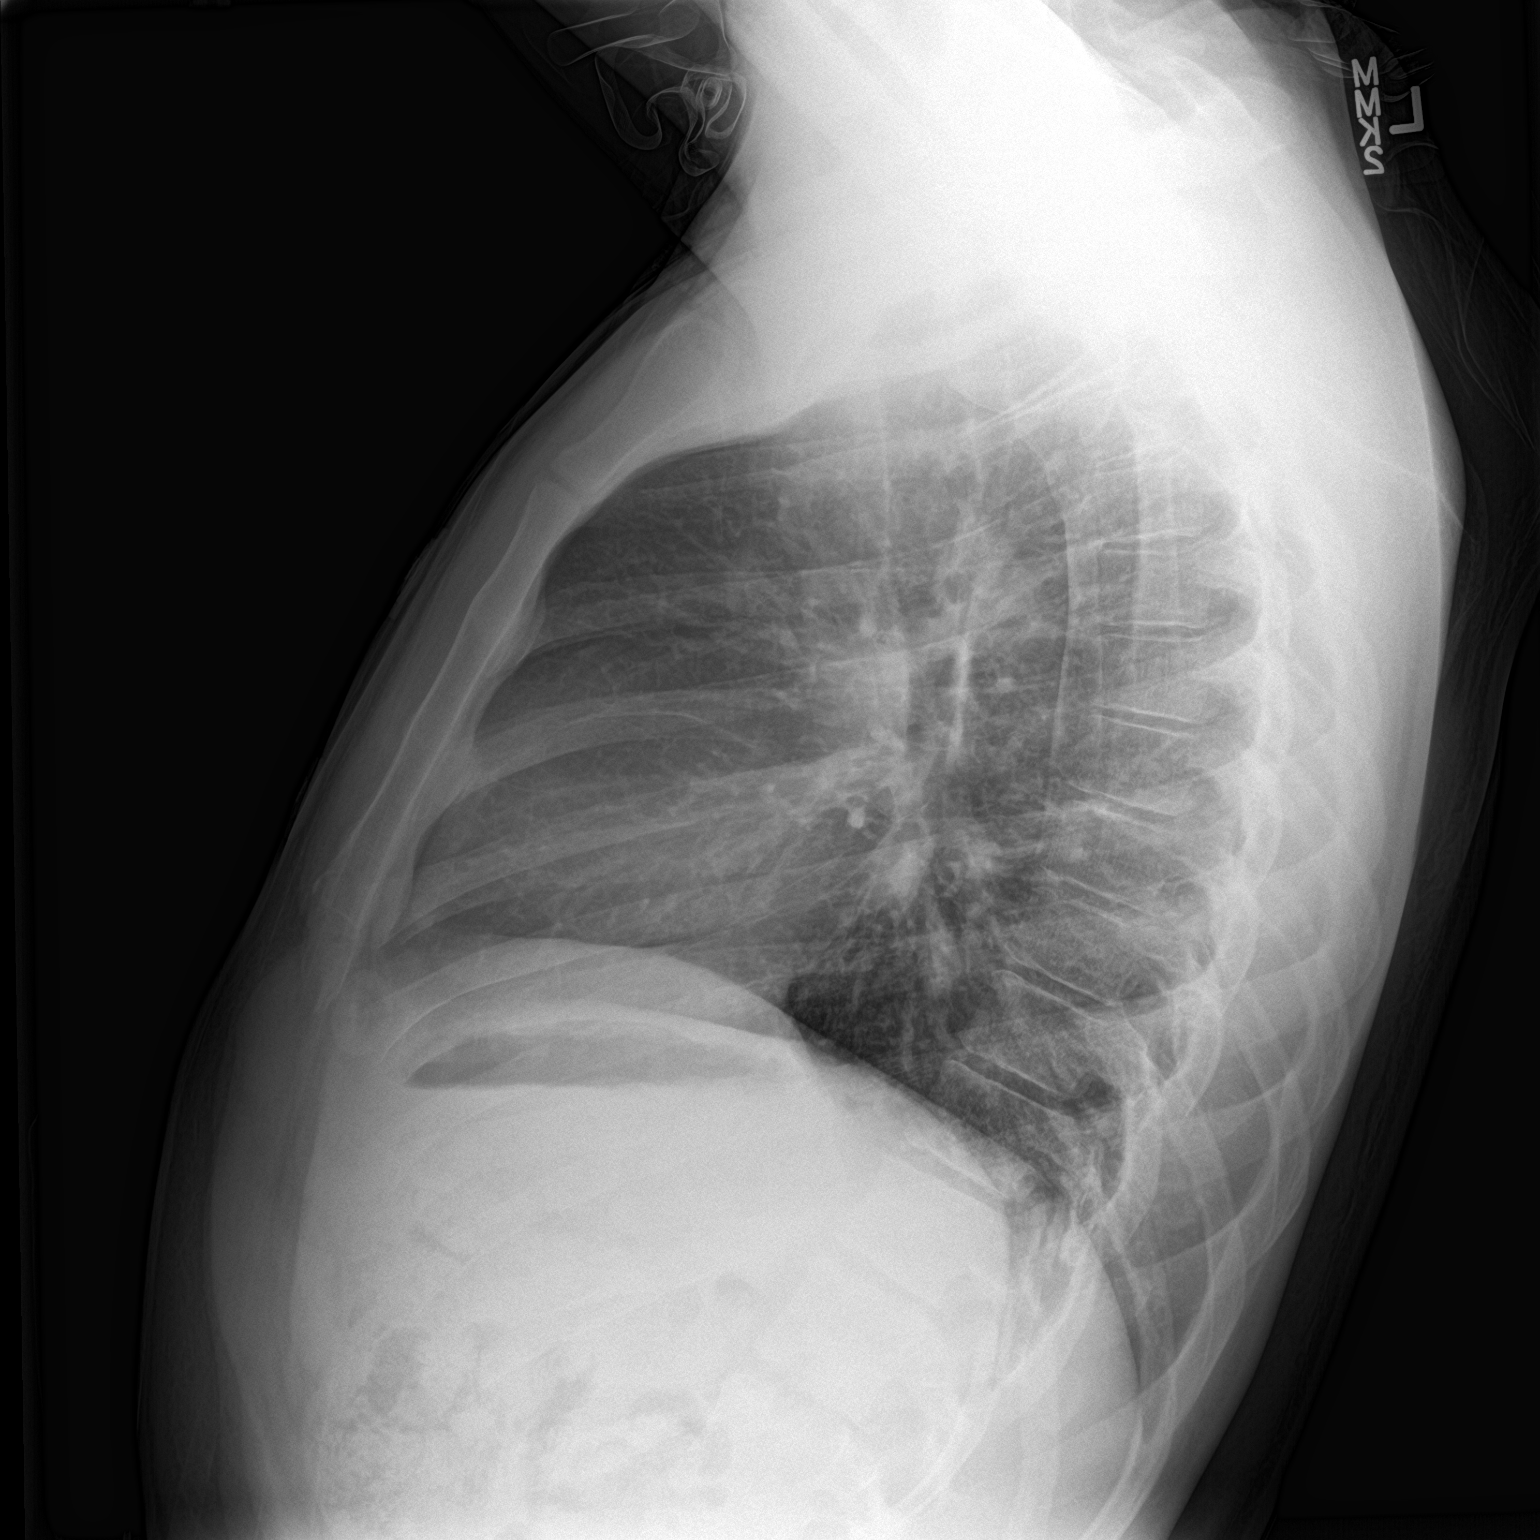

[2 of 2 positions shown; findings below may reference images not displayed]

FINDINGS: Normal heart size and mediastinal contours. No acute infiltrate or
edema. Artifact from EKG leads. No effusion or pneumothorax. No
acute osseous findings.
IMPRESSION: Negative chest.

## 2020-10-10 ENCOUNTER — Emergency Department (HOSPITAL_COMMUNITY): Payer: Self-pay

## 2020-10-10 ENCOUNTER — Encounter (HOSPITAL_COMMUNITY): Payer: Self-pay | Admitting: Emergency Medicine

## 2020-10-10 ENCOUNTER — Emergency Department (HOSPITAL_COMMUNITY)
Admission: EM | Admit: 2020-10-10 | Discharge: 2020-10-10 | Disposition: A | Discharge status: Home / Self Care | Payer: Self-pay | Attending: Emergency Medicine | Admitting: Emergency Medicine

## 2020-10-10 ENCOUNTER — Other Ambulatory Visit: Payer: Self-pay

## 2020-10-10 DIAGNOSIS — Z87891 Personal history of nicotine dependence: Secondary | ICD-10-CM | POA: Insufficient documentation

## 2020-10-10 DIAGNOSIS — J069 Acute upper respiratory infection, unspecified: Secondary | ICD-10-CM | POA: Insufficient documentation

## 2020-10-10 DIAGNOSIS — R079 Chest pain, unspecified: Secondary | ICD-10-CM

## 2020-10-10 DIAGNOSIS — Z20822 Contact with and (suspected) exposure to covid-19: Secondary | ICD-10-CM | POA: Insufficient documentation

## 2020-10-10 LAB — SARS CORONAVIRUS 2 (TAT 6-24 HRS): SARS Coronavirus 2: NEGATIVE

## 2020-10-10 MED ORDER — AEROCHAMBER PLUS FLO-VU LARGE MISC
Status: AC
  Filled 2020-10-10: qty 1

## 2020-10-10 MED ORDER — ALBUTEROL SULFATE HFA 108 (90 BASE) MCG/ACT IN AERS
2.0000 | INHALATION_SPRAY | RESPIRATORY_TRACT | Status: DC | PRN
  Administered 2020-10-10: 2 via RESPIRATORY_TRACT
  Filled 2020-10-10: qty 6.7

## 2020-10-10 MED ORDER — ACETAMINOPHEN 500 MG PO TABS
1000.0000 mg | ORAL_TABLET | Freq: Once | ORAL | Status: AC
  Administered 2020-10-10: 1000 mg via ORAL
  Filled 2020-10-10: qty 2

## 2020-10-10 MED ORDER — AEROCHAMBER PLUS FLO-VU LARGE MISC
1.0000 | Freq: Once | Status: AC
  Administered 2020-10-10: 1

## 2020-10-10 NOTE — ED Triage Notes (Signed)
C/o sore throat, runny nose, and congestion since yesterday.

## 2020-10-10 NOTE — ED Provider Notes (Signed)
[name] EMERGENCY DEPARTMENT Provider Note   CSN: [id number] Arrival date & time: 10/10/20  7035     History Chief Complaint  Patient presents with  . Sore Throat  . Nasal Congestion    Charles Rollins is a 35 y.o. male with no pertinent past medical history that presents to the emerge department today for sore throat, runny nose and congestion since yesterday.  Patient also admits to a slightly productive cough.  States that he has been coughing a lot, will sometimes notice some flecks of blood when he coughs.  This happened once yesterday.  States that his cough is productive.  Denies any facial pain or fevers.  Has been taking anything for this  Patient denies any sick contacts.  States that he had one vaccination against COVID years ago a year ago.  Denies any nausea, vomiting, rash, diarrhea, myalgias, headache.  States that his right side of his ribs hurt from coughing.  No true chest pain or shortness of breath.  No difficulty breathing.  No drooling, trismus or potato voice.  HPI     History reviewed. No pertinent past medical history.  There are no problems to display for this patient.   History reviewed. No pertinent surgical history.     No family history on file.  Social History   Tobacco Use  . Smoking status: Former Games developer  . Smokeless tobacco: Never Used  Vaping Use  . Vaping Use: Never used  Substance Use Topics  . Alcohol use: Yes  . Drug use: Yes    Types: Marijuana    Home Medications Prior to Admission medications   Medication Sig Start Date End Date Taking? Authorizing Provider  benzonatate (TESSALON) 100 MG capsule Take 1 capsule (100 mg total) by mouth every 8 (eight) hours. 02/08/19   Bill Salinas, PA-C    Allergies    Patient has no known allergies.  Review of Systems   Review of Systems  Constitutional: Negative for chills, diaphoresis, fatigue and fever.  HENT: Positive for congestion, postnasal drip and  sore throat. Negative for drooling, ear discharge, ear pain, facial swelling, hearing loss, nosebleeds, sinus pressure, sinus pain and trouble swallowing.   Eyes: Negative for pain and visual disturbance.  Respiratory: Negative for cough, shortness of breath and wheezing.   Cardiovascular: Negative for chest pain, palpitations and leg swelling.  Gastrointestinal: Negative for abdominal distention, abdominal pain, diarrhea, nausea and vomiting.  Genitourinary: Negative for difficulty urinating.  Musculoskeletal: Negative for back pain, neck pain and neck stiffness.  Skin: Negative for pallor.  Neurological: Negative for dizziness, speech difficulty, weakness and headaches.  Psychiatric/Behavioral: Negative for confusion.    Physical Exam Updated Vital Signs BP 123/82 (BP Location: Right Arm)   Pulse 72   Temp 98.6 F (37 C) (Oral)   Resp 16   SpO2 94%   Physical Exam Constitutional:      General: He is not in acute distress.    Appearance: Normal appearance. He is not ill-appearing, toxic-appearing or diaphoretic.     Comments: Patient without acute respiratory stress.  Patient is sitting comfortably in bed, no tripoding, use of accessory muscles.  Patient is speaking to me in full sentences.  Handling secretions well.  HENT:     Head: Normocephalic and atraumatic.     Jaw: There is normal jaw occlusion. No trismus, swelling or malocclusion.     Nose: Congestion present. No rhinorrhea.     Right Sinus: No maxillary sinus  tenderness or frontal sinus tenderness.     Left Sinus: No maxillary sinus tenderness or frontal sinus tenderness.     Mouth/Throat:     Mouth: Mucous membranes are moist. No oral lesions.     Dentition: Normal dentition.     Tongue: No lesions.     Palate: No mass and lesions.     Pharynx: Oropharynx is clear. Uvula midline. Posterior oropharyngeal erythema present. No pharyngeal swelling, oropharyngeal exudate or uvula swelling.     Tonsils: No tonsillar  exudate or tonsillar abscesses. 3+ on the right. 3+ on the left.     Comments: Patient does have tonsillar enlargement, no exudate.  Slightly erythematous in the pharynx.  No signs of peritonsillar abscess, palate without any tenderness or masses palpated.  No swelling under the tongue, uvula is midline without any inflammation.  Handling secretions well. Eyes:     General: No visual field deficit.       Right eye: No discharge.        Left eye: No discharge.     Extraocular Movements: Extraocular movements intact.     Conjunctiva/sclera: Conjunctivae normal.     Pupils: Pupils are equal, round, and reactive to light.  Cardiovascular:     Rate and Rhythm: Normal rate and regular rhythm.     Pulses: Normal pulses.     Heart sounds: Normal heart sounds. No murmur heard. No friction rub. No gallop.   Pulmonary:     Effort: Pulmonary effort is normal. No respiratory distress.     Breath sounds: Normal breath sounds. No stridor. No wheezing, rhonchi or rales.  Chest:     Chest wall: No tenderness.       Comments: Patient does have some tenderness in this area, worse when he lifts up his arm.  No erythema or objective numbness.  No rashes. Abdominal:     General: Abdomen is flat. Bowel sounds are normal. There is no distension.     Palpations: Abdomen is soft.     Tenderness: There is no abdominal tenderness. There is no right CVA tenderness or left CVA tenderness.  Musculoskeletal:        General: No swelling or tenderness. Normal range of motion.     Cervical back: Normal range of motion. No rigidity or tenderness.     Right lower leg: No edema.     Left lower leg: No edema.  Lymphadenopathy:     Cervical: No cervical adenopathy.  Skin:    General: Skin is warm and dry.     Capillary Refill: Capillary refill takes less than 2 seconds.     Findings: No erythema or rash.  Neurological:     General: No focal deficit present.     Mental Status: He is alert and oriented to person,  place, and time.     Cranial Nerves: Cranial nerves are intact. No cranial nerve deficit or facial asymmetry.     Motor: Motor function is intact. No weakness.     Coordination: Coordination is intact.     Gait: Gait is intact. Gait normal.  Psychiatric:        Mood and Affect: Mood normal.     ED Results / Procedures / Treatments   Labs (all labs ordered are listed, but only abnormal results are displayed) Labs Reviewed  SARS CORONAVIRUS 2 (TAT 6-24 HRS)    EKG EKG Interpretation  Date/Time:  Monday October 10 2020 11:47:23 EDT Ventricular Rate:  58 PR Interval:  148  QRS Duration: 94 QT Interval:  378 QTC Calculation: 371 R Axis:   86 Text Interpretation: Sinus bradycardia Nonspecific T wave abnormality Abnormal ECG similar to previous,rate slower Confirmed by Frederick Peers [phone]) on 10/10/2020 11:52:45 AM   Radiology DG Chest 2 View  Result Date: 10/10/2020 CLINICAL DATA:  Chest pain EXAM: CHEST - 2 VIEW COMPARISON:  02/08/2019 FINDINGS: Normal heart size and mediastinal contours. No acute infiltrate or edema. No effusion or pneumothorax. No acute osseous findings. IMPRESSION: No active cardiopulmonary disease. Electronically Signed   By: Marnee Spring M.D.   On: 10/10/2020 10:44    Procedures Procedures   Medications Ordered in ED Medications  albuterol (VENTOLIN HFA) 108 (90 Base) MCG/ACT inhaler 2 puff (2 puffs Inhalation Given 10/10/20 1205)  AeroChamber Plus Flo-Vu Large MISC (has no administration in time range)  acetaminophen (TYLENOL) tablet 1,000 mg (1,000 mg Oral Given 10/10/20 1118)  AeroChamber Plus Flo-Vu Large MISC 1 each (1 each Other Given 10/10/20 1204)    ED Course  I have reviewed the triage vital signs and the nursing notes.  Pertinent labs & imaging results that were available during my care of the patient were reviewed by me and considered in my medical decision making (see chart for details).    MDM Rules/Calculators/A&P                           PLEAS CARNEAL is a 35 y.o. male with no pertinent past medical history that presents to the emerge department today for sore throat, runny nose and congestion since yesterday.  Patient appears well, hemodynamically stable, afebrile and nontachycardic.  Patient does have erythema and tonsillar enlargement.  Less likely to be strep, does not appear like strep and patient also has a cough.  Patient does also have some point tenderness to right ribs, most likely irritation from coughing.  Will obtain EKG and chest x-ray.  Upon reevaluation feels better with Tylenol.  Work-up today reassuring, COVID test pending.  Symptomatic treatment discussed in regards to viral URI.  Patient expressed understanding.  Patient be discharged. Doubt need for further emergent work up at this time. I explained the diagnosis and have given explicit precautions to return to the ER including for any other new or worsening symptoms. The patient understands and accepts the medical plan as it's been dictated and I have answered their questions. Discharge instructions concerning home care and prescriptions have been given. The patient is STABLE and is discharged to home in good condition.     Final Clinical Impression(s) / ED Diagnoses Final diagnoses:  Viral upper respiratory tract infection    Rx / DC Orders ED Discharge Orders    None       Farrel Gordon, PA-C 10/10/20 1554    Little, Ambrose Finland, MD 10/11/20 0700

## 2020-10-10 NOTE — Discharge Instructions (Addendum)
You were seen today for an upper respiratory infection. Use Flonase as directed for your congestion and Mucinex/Robitussin for your cough. Continue to stay well-hydrated. Gargle warm salt water and spit it out for sore throat. Take benadryl or other antihistamine to decrease secretions and for watery itchy eyes. Continued to alternate between Tylenol and ibuprofen for pain. Followup with your primary care doctor in 5-7 days or referral for urgent care for recheck of ongoing symptoms however return to emergency department for emergent changing or worsening of symptoms- example: shortness of breath, chest pain, difficulty swallowing, muffled voice.

## 2021-01-10 ENCOUNTER — Other Ambulatory Visit: Payer: Self-pay

## 2021-01-10 ENCOUNTER — Emergency Department (HOSPITAL_COMMUNITY)
Admission: EM | Admit: 2021-01-10 | Discharge: 2021-01-11 | Disposition: A | Discharge status: Home / Self Care | Payer: Self-pay | Attending: Emergency Medicine | Admitting: Emergency Medicine

## 2021-01-10 ENCOUNTER — Emergency Department (HOSPITAL_COMMUNITY): Payer: Self-pay

## 2021-01-10 ENCOUNTER — Encounter (HOSPITAL_COMMUNITY): Payer: Self-pay | Admitting: Emergency Medicine

## 2021-01-10 DIAGNOSIS — Y990 Civilian activity done for income or pay: Secondary | ICD-10-CM | POA: Insufficient documentation

## 2021-01-10 DIAGNOSIS — Z79891 Long term (current) use of opiate analgesic: Secondary | ICD-10-CM | POA: Insufficient documentation

## 2021-01-10 DIAGNOSIS — W230XXA Caught, crushed, jammed, or pinched between moving objects, initial encounter: Secondary | ICD-10-CM | POA: Insufficient documentation

## 2021-01-10 DIAGNOSIS — S9781XA Crushing injury of right foot, initial encounter: Secondary | ICD-10-CM | POA: Insufficient documentation

## 2021-01-10 DIAGNOSIS — Z23 Encounter for immunization: Secondary | ICD-10-CM | POA: Insufficient documentation

## 2021-01-10 DIAGNOSIS — M79671 Pain in right foot: Secondary | ICD-10-CM | POA: Insufficient documentation

## 2021-01-10 DIAGNOSIS — T1490XA Injury, unspecified, initial encounter: Secondary | ICD-10-CM

## 2021-01-10 NOTE — ED Triage Notes (Addendum)
Pt here for R foot pain, pt dropped something on it at work, has swelling and bruising to first 3 toes. Pt having difficulty walking on it. +2 pedal pulse

## 2021-01-10 NOTE — ED Provider Notes (Signed)
Emergency Medicine Provider Triage Evaluation Note  Charles Rollins , a 35 y.o. male  was evaluated in triage.  Pt complains of right foot pain after it was run over by a pallet jack at work.  There is bruising and swelling to the first 3 toes of the right foot.  Some tingling in the foot.  Review of Systems  Positive: Foot injury, bruising, swelling Negative: Weakness in the le  Physical Exam  BP 124/78 (BP Location: Right Arm)   Pulse 86   Temp 98.8 F (37.1 C)   Resp 16   Ht 6' (1.829 m)   Wt 79.4 kg   SpO2 97%   BMI 23.73 kg/m  Gen:   Awake, no distress   Resp:  Normal effort  MSK:   Moves extremities without difficulty  Other:  Bruising, edema to the first second and third toes of the right foot extending into the metatarsals.  Full range of motion of the ankle.  Normal capillary refill and sensation.  Medical Decision Making  Medically screening exam initiated at 9:55 PM.  Appropriate orders placed.  Charles Rollins was informed that the remainder of the evaluation will be completed by another provider, this initial triage assessment does not replace that evaluation, and the importance of remaining in the ED until their evaluation is complete.  This chart was dictated using voice recognition software, Dragon. Despite the best efforts of this provider to proofread and correct errors, errors may still occur which can change documentation meaning.    Sherrilee Gilles 01/10/21 2156    Tegeler, Canary Brim, MD 01/10/21 2203

## 2021-01-11 MED ORDER — OXYCODONE-ACETAMINOPHEN 5-325 MG PO TABS
1.0000 | ORAL_TABLET | Freq: Once | ORAL | Status: AC
  Administered 2021-01-11: 1 via ORAL
  Filled 2021-01-11: qty 1

## 2021-01-11 MED ORDER — TETANUS-DIPHTH-ACELL PERTUSSIS 5-2.5-18.5 LF-MCG/0.5 IM SUSY
0.5000 mL | PREFILLED_SYRINGE | Freq: Once | INTRAMUSCULAR | Status: AC
  Administered 2021-01-11: 0.5 mL via INTRAMUSCULAR
  Filled 2021-01-11: qty 0.5

## 2021-01-11 MED ORDER — OXYCODONE-ACETAMINOPHEN 5-325 MG PO TABS: 1.0000 | ORAL_TABLET | Freq: Three times a day (TID) | ORAL | 0 refills | Status: DC | PRN

## 2021-01-11 NOTE — ED Provider Notes (Signed)
[facility] EMERGENCY DEPARTMENT Provider Note   CSN: [id number] Arrival date & time: 01/10/21  2039     History Chief Complaint  Patient presents with   Foot Injury    Charles Rollins is a 35 y.o. male with no chronic medical conditions who presents to the emergency department with a chief complaint of right foot injury.  The patient was at work earlier today when a power pallet device ran over his right foot.  He reports constant, severe, aching, throbbing pain to the first and second toes as well as the dorsum of the right foot.  There is associated bruising.  He has had difficulty weightbearing since the injury.  No numbness, weakness, right ankle or knee pain, fever, chills, foot swelling, redness, warmth, red streaking.  No treatment prior to arrival.  He is unsure when his Tdap was last updated.  The history is provided by the patient and medical records. No language interpreter was used.      History reviewed. No pertinent past medical history.  There are no problems to display for this patient.   History reviewed. No pertinent surgical history.     History reviewed. No pertinent family history.  Social History   Tobacco Use   Smoking status: Former   Smokeless tobacco: Never  Building services engineer Use: Never used  Substance Use Topics   Alcohol use: Yes   Drug use: Yes    Types: Marijuana    Home Medications Prior to Admission medications   Medication Sig Start Date End Date Taking? Authorizing Provider  oxyCODONE-acetaminophen (PERCOCET/ROXICET) 5-325 MG tablet Take 1 tablet by mouth every 8 (eight) hours as needed for severe pain. 01/11/21  Yes Ahna Konkle A, PA-C  benzonatate (TESSALON) 100 MG capsule Take 1 capsule (100 mg total) by mouth every 8 (eight) hours. 02/08/19   Bill Salinas, PA-C    Allergies    Patient has no known allergies.  Review of Systems   Review of Systems  Constitutional:  Negative for activity change,  chills and fever.  Respiratory:  Negative for shortness of breath.   Cardiovascular:  Negative for chest pain.  Gastrointestinal:  Negative for abdominal pain, constipation, diarrhea, nausea and vomiting.  Musculoskeletal:  Positive for arthralgias, gait problem and myalgias. Negative for back pain.  Skin:  Positive for color change and wound. Negative for rash.  Neurological:  Negative for seizures, syncope, weakness, light-headedness and numbness.   Physical Exam Updated Vital Signs BP 124/78 (BP Location: Right Arm)   Pulse 86   Temp 98.8 F (37.1 C)   Resp 16   Ht 6' (1.829 m)   Wt 79.4 kg   SpO2 97%   BMI 23.73 kg/m   Physical Exam Vitals and nursing note reviewed.  Constitutional:      General: He is not in acute distress.    Appearance: He is well-developed. He is not ill-appearing, toxic-appearing or diaphoretic.  HENT:     Head: Normocephalic.  Eyes:     Conjunctiva/sclera: Conjunctivae normal.  Cardiovascular:     Rate and Rhythm: Normal rate and regular rhythm.  Pulmonary:     Effort: Pulmonary effort is normal.  Abdominal:     General: There is no distension.     Palpations: Abdomen is soft.  Musculoskeletal:     Cervical back: Neck supple.     Comments: Tender to palpation to the first and second digits diffusely as well as to the forefoot of  the right foot.Decreased range of motion secondary to pain.  Sensations intact all 4 distal aspects of all digits on the right foot.  DP and PT pulses are 2+ and symmetric.  Foot is warm and well-perfused.  There is no erythema, warmth, red streaking.  Right ankle is nontender to palpation with full active and passive range of motion.  Good strength against resistance with dorsiflexion plantarflexion.  Skin:    General: Skin is warm and dry.     Comments: Ecchymosis noted to the dorsum of the right foot at the base of the second and third digits.  Ecchymosis extends to the first, second, third, and fourth digits.  There is  also ecchymosis on the plantar surface of the second digit.   Neurological:     Mental Status: He is alert.  Psychiatric:        Behavior: Behavior normal.       ED Results / Procedures / Treatments   Labs (all labs ordered are listed, but only abnormal results are displayed) Labs Reviewed - No data to display  EKG None  Radiology DG Foot Complete Right  Result Date: 01/10/2021 CLINICAL DATA:  Foot injury EXAM: RIGHT FOOT COMPLETE - 3+ VIEW COMPARISON:  None. FINDINGS: There is no evidence of fracture or dislocation. There is no evidence of arthropathy or other focal bone abnormality. Soft tissues are unremarkable. IMPRESSION: Negative. Electronically Signed   By: Jasmine Pang M.D.   On: 01/10/2021 22:08    Procedures Procedures   Medications Ordered in ED Medications  oxyCODONE-acetaminophen (PERCOCET/ROXICET) 5-325 MG per tablet 1 tablet (has no administration in time range)  Tdap (BOOSTRIX) injection 0.5 mL (has no administration in time range)    ED Course  I have reviewed the triage vital signs and the nursing notes.  Pertinent labs & imaging results that were available during my care of the patient were reviewed by me and considered in my medical decision making (see chart for details).    MDM Rules/Calculators/A&P                           35 year old male who presents the emergency department with right foot pain after a power pallet device ran over his foot at work earlier today.  He reports associated pain in the right foot and toes and has had difficulty walking.  There is bruising noted to the foot.  Vital signs are stable.  Imaging has been reviewed and independently interpreted by me.  No fractures or dislocations noted on x-ray.  No evidence of compartment syndrome noted on exam.  Patient does have considerable pain noted to the forefoot as well as bruising.  Given mechanism of injury, will buddy tape the second and third digits together.  We will place the  patient in a cam walker with crutches and discharged home with weightbearing as tolerated.  Pain controlled in the ED.  RICE therapy has been recommended.  We will give him a referral to orthopedics given concern for occult fracture.  All questions answered and patient is in agreement with this plan.  Tdap updated.  Strict return precautions given.  He is hemodynamically stable in no acute distress.  Safer discharge home with outpatient follow-up as discussed.  Final Clinical Impression(s) / ED Diagnoses Final diagnoses:  Trauma  Crushing injury of right foot, initial encounter    Rx / DC Orders ED Discharge Orders  Ordered    oxyCODONE-acetaminophen (PERCOCET/ROXICET) 5-325 MG tablet  Every 8 hours PRN        01/11/21 0055             Barkley Boards, PA-C 01/11/21 0055    Sabas Sous, MD 01/11/21 [phone]

## 2021-01-11 NOTE — Progress Notes (Signed)
Orthopedic Tech Progress Note Patient Details:  Charles Rollins May 31, 1986 [id number]  Ortho Devices Type of Ortho Device: Crutches, Buddy tape, CAM walker Ortho Device/Splint Location: RLE Ortho Device/Splint Interventions: Ordered, Application, Adjustment   Post Interventions Patient Tolerated: Well Instructions Provided: Adjustment of device, Care of device, Poper ambulation with device  Charles Rollins 01/11/2021, 1:23 AM

## 2021-01-11 NOTE — Discharge Instructions (Addendum)
Thank you for allowing me to care for you today in the Emergency Department.   Although your x-ray did not show any broken bones, you are being treated as if one of the bones in your foot or toes are broken.  Keep the second and third toes taped together.  This is called buddy taping.  I have included instructions on how to do this at home.  Wear the cam walker on your foot.  You can use the crutches as needed until you can weight-bear as tolerated on your right foot.  Follow-up with the orthopedist if your symptoms do not significantly improve with this regimen in the next week.  Take 650 mg of Tylenol or 600 mg of ibuprofen with food every 6 hours for pain.  You can alternate between these 2 medications every 3 hours if your pain returns.  For instance, you can take Tylenol at noon, followed by a dose of ibuprofen at 3, followed by second dose of Tylenol and 6.  For severe, uncontrollable pain, take 1 tablet Percocet every 8 hours as needed.  Shallow for consecutive 325 mg of Tylenol.  Do not take more than 4000 mg of Tylenol from all sources in a 24-hour period.  Do not work or drive while taking Percocet as it is a narcotic and can cause you to be impaired.  Do not drink alcohol or take other sedating substances with this medication.  Elevate your leg so that your toes are at or above the level of your heart.  Apply ice pack for 15 to 20 minutes up to 3-4 times a day.  Your tetanus was updated today and is good for the next 10 years.  Return to the emergency department if you develop significantly worsening swelling, redness, pain that spreads to the foot and lower leg, if your toes turn blue, if you have any fall or injury, have new or worsening numbness or weakness, or other new, concerning symptoms.

## 2021-01-19 ENCOUNTER — Other Ambulatory Visit: Payer: Self-pay

## 2021-01-19 ENCOUNTER — Ambulatory Visit (HOSPITAL_COMMUNITY)
Admission: EM | Admit: 2021-01-19 | Discharge: 2021-01-19 | Disposition: A | Discharge status: Home / Self Care | Payer: Self-pay | Attending: Internal Medicine | Admitting: Internal Medicine

## 2021-01-19 ENCOUNTER — Encounter (HOSPITAL_COMMUNITY): Payer: Self-pay

## 2021-01-19 DIAGNOSIS — M79671 Pain in right foot: Secondary | ICD-10-CM

## 2021-01-19 NOTE — ED Triage Notes (Signed)
Pt presents for evaluation to go back to work. Reports 1 week ago he dropped a pallet jack right foot at work. Pt has a cam boot on.

## 2021-01-19 NOTE — ED Provider Notes (Signed)
[facility]    CSN: [id number] Arrival date & time: 01/19/21  1439      History   Chief Complaint Chief Complaint  Patient presents with   Foot Pain   Letter for School/Work    HPI Charles Rollins is a 35 y.o. male comes to the urgent care requesting a note to return to work.  He was diagnosed with a right foot contusion last week after he had a workplace injury.  Patient is doing better.  Pain is better controlled.  There was no fracture on x-ray.  Patient wants to return to work tomorrow. HPI  History reviewed. No pertinent past medical history.  There are no problems to display for this patient.   History reviewed. No pertinent surgical history.     Home Medications    Prior to Admission medications   Medication Sig Start Date End Date Taking? Authorizing Provider  benzonatate (TESSALON) 100 MG capsule Take 1 capsule (100 mg total) by mouth every 8 (eight) hours. 02/08/19   Bill Salinas, PA-C  oxyCODONE-acetaminophen (PERCOCET/ROXICET) 5-325 MG tablet Take 1 tablet by mouth every 8 (eight) hours as needed for severe pain. 01/11/21   McDonald, Coral Else, PA-C    Family History History reviewed. No pertinent family history.  Social History Social History   Tobacco Use   Smoking status: Former   Smokeless tobacco: Never  Building services engineer Use: Never used  Substance Use Topics   Alcohol use: Yes   Drug use: Yes    Types: Marijuana     Allergies   Patient has no known allergies.   Review of Systems Review of Systems  Musculoskeletal:  Positive for arthralgias.  Neurological: Negative.     Physical Exam Triage Vital Signs ED Triage Vitals  Enc Vitals Group     BP 01/19/21 1514 123/86     Pulse Rate 01/19/21 1514 89     Resp 01/19/21 1514 16     Temp 01/19/21 1514 98.9 F (37.2 C)     Temp Source 01/19/21 1514 Oral     SpO2 01/19/21 1514 100 %     Weight --      Height --      Head Circumference --      Peak Flow --       Pain Score 01/19/21 1512 4     Pain Loc --      Pain Edu? --      Excl. in GC? --    No data found.  Updated Vital Signs BP 123/86 (BP Location: Right Arm)   Pulse 89   Temp 98.9 F (37.2 C) (Oral)   Resp 16   SpO2 100%   Visual Acuity Right Eye Distance:   Left Eye Distance:   Bilateral Distance:    Right Eye Near:   Left Eye Near:    Bilateral Near:     Physical Exam Vitals reviewed.  Constitutional:      Appearance: Normal appearance.  Cardiovascular:     Rate and Rhythm: Normal rate and regular rhythm.  Pulmonary:     Effort: Pulmonary effort is normal.     Breath sounds: Normal breath sounds.  Musculoskeletal:        General: Normal range of motion.  Neurological:     General: No focal deficit present.     Mental Status: He is alert.     UC Treatments / Results  Labs (all labs ordered are listed, but  only abnormal results are displayed) Labs Reviewed - No data to display  EKG   Radiology No results found.  Procedures Procedures (including critical care time)  Medications Ordered in UC Medications - No data to display  Initial Impression / Assessment and Plan / UC Course  I have reviewed the triage vital signs and the nursing notes.  Pertinent labs & imaging results that were available during my care of the patient were reviewed by me and considered in my medical decision making (see chart for details).     1.  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  Final Clinical Impressions(s) / UC Diagnoses   Final diagnoses:  Foot pain, right   Discharge Instructions   None    ED Prescriptions   None    PDMP not reviewed this encounter.   Merrilee Jansky, MD 01/19/21 [phone]

## 2021-03-12 ENCOUNTER — Other Ambulatory Visit: Payer: Self-pay

## 2021-03-12 ENCOUNTER — Emergency Department (HOSPITAL_COMMUNITY)
Admission: EM | Admit: 2021-03-12 | Discharge: 2021-03-13 | Disposition: A | Discharge status: Home / Self Care | Payer: Self-pay | Attending: Emergency Medicine | Admitting: Emergency Medicine

## 2021-03-12 ENCOUNTER — Emergency Department (HOSPITAL_COMMUNITY): Payer: Self-pay

## 2021-03-12 DIAGNOSIS — R110 Nausea: Secondary | ICD-10-CM | POA: Insufficient documentation

## 2021-03-12 DIAGNOSIS — R202 Paresthesia of skin: Secondary | ICD-10-CM | POA: Insufficient documentation

## 2021-03-12 DIAGNOSIS — R079 Chest pain, unspecified: Secondary | ICD-10-CM | POA: Insufficient documentation

## 2021-03-12 DIAGNOSIS — R001 Bradycardia, unspecified: Secondary | ICD-10-CM | POA: Insufficient documentation

## 2021-03-12 DIAGNOSIS — Z87891 Personal history of nicotine dependence: Secondary | ICD-10-CM | POA: Insufficient documentation

## 2021-03-12 DIAGNOSIS — R0602 Shortness of breath: Secondary | ICD-10-CM | POA: Insufficient documentation

## 2021-03-12 LAB — TROPONIN I (HIGH SENSITIVITY): Troponin I (High Sensitivity): 2 ng/L (ref <18)

## 2021-03-12 LAB — DIFFERENTIAL
Abs Immature Granulocytes: 0.01 10*3/uL (ref 0.00–0.07)
Basophils Absolute: 0.1 10*3/uL (ref 0.0–0.1)
Basophils Relative: 1 %
Eosinophils Absolute: 0.1 10*3/uL (ref 0.0–0.5)
Eosinophils Relative: 1 %
Immature Granulocytes: 0 %
Lymphocytes Relative: 46 %
Lymphs Abs: 2.9 10*3/uL (ref 0.7–4.0)
Monocytes Absolute: 0.6 10*3/uL (ref 0.1–1.0)
Monocytes Relative: 10 %
Neutro Abs: 2.7 10*3/uL (ref 1.7–7.7)
Neutrophils Relative %: 42 %

## 2021-03-12 LAB — CBC
HCT: 44.7 % (ref 39.0–52.0)
Hemoglobin: 15.1 g/dL (ref 13.0–17.0)
MCH: 31.6 pg (ref 26.0–34.0)
MCHC: 33.8 g/dL (ref 30.0–36.0)
MCV: 93.5 fL (ref 80.0–100.0)
Platelets: 226 10*3/uL (ref 150–400)
RBC: 4.78 MIL/uL (ref 4.22–5.81)
RDW: 12.8 % (ref 11.5–15.5)
WBC: 6.4 10*3/uL (ref 4.0–10.5)
nRBC: 0 % (ref 0.0–0.2)

## 2021-03-12 LAB — BASIC METABOLIC PANEL
Anion gap: 8 (ref 5–15)
BUN: 14 mg/dL (ref 6–20)
CO2: 23 mmol/L (ref 22–32)
Calcium: 9.4 mg/dL (ref 8.9–10.3)
Chloride: 106 mmol/L (ref 98–111)
Creatinine, Ser: 1.06 mg/dL (ref 0.61–1.24)
GFR, Estimated: >60 mL/min (ref >60)
Glucose, Bld: 93 mg/dL (ref 70–99)
Potassium: 3.6 mmol/L (ref 3.5–5.1)
Sodium: 137 mmol/L (ref 135–145)

## 2021-03-12 NOTE — ED Triage Notes (Signed)
Pt c/o cp and sob x 3 days

## 2021-03-12 NOTE — ED Provider Notes (Signed)
Emergency Medicine Provider Triage Evaluation Note  Charles Rollins , a 35 y.o. male  was evaluated in triage.  Pt complains of chest pain.  Started 2 days ago, is intermittent.  Comes at rest and with movement, associated with some shortness of breath.  No nausea or vomiting.  No history of high blood pressure, states he does not smoke cigarettes, no history of diabetes..  Review of Systems  Positive: Chest pain, SOB Negative: Nausea  Physical Exam  BP (!) 140/96 (BP Location: Left Arm)   Pulse 77   Temp 98.7 F (37.1 C) (Oral)   Resp 16   SpO2 100%  Gen:   Awake, no distress   Resp:  Normal effort  MSK:   Moves extremities without difficulty  Other:    Medical Decision Making  Medically screening exam initiated at 6:35 PM.  Appropriate orders placed.  Charles Rollins was informed that the remainder of the evaluation will be completed by another provider, this initial triage assessment does not replace that evaluation, and the importance of remaining in the ED until their evaluation is complete.  Chest pain work-up   Theron Arista, Cordelia Poche 03/12/21 Ileene Hutchinson, MD 03/13/21 [phone]

## 2021-03-13 LAB — TROPONIN I (HIGH SENSITIVITY): Troponin I (High Sensitivity): <2 ng/L (ref <18)

## 2021-03-13 MED ORDER — HYDROXYZINE HCL 25 MG PO TABS: 25.0000 mg | ORAL_TABLET | Freq: Three times a day (TID) | ORAL | 0 refills | Status: AC | PRN

## 2021-03-13 NOTE — Discharge Instructions (Signed)
You were seen in the emergency department today for chest pain. Your work-up in the emergency department has been overall reassuring. Your labs have been fairly normal and or similar to previous blood work you have had done. Your EKG and the enzyme we use to check your heart did not show an acute heart attack at this time. Your chest x-ray was normal.   We are sending you home with atarax to take every 8 hours as needed for anxiety/nausea/vomiting.   We have prescribed you new medication(s) today. Discuss the medications prescribed today with your pharmacist as they can have adverse effects and interactions with your other medicines including over the counter and prescribed medications. Seek medical evaluation if you start to experience new or abnormal symptoms after taking one of these medicines, seek care immediately if you start to experience difficulty breathing, feeling of your throat closing, facial swelling, or rash as these could be indications of a more serious allergic reaction  We would like you to follow up closely with your primary care provider and/or the cardiologist provided in your discharge instructions within 1-3 days. Return to the ER immediately should you experience any new or worsening symptoms including but not limited to return of pain, worsened pain, vomiting, shortness of breath, dizziness, lightheadedness, passing out, or any other concerns that you may have.

## 2021-03-13 NOTE — ED Notes (Signed)
Patient reports no chest pain at the moment. States it feels hard to breathe and take a deep breath.

## 2021-03-13 NOTE — ED Provider Notes (Signed)
[facility]-EMERGENCY DEPT Provider Note   CSN: [id number] Arrival date & time: 03/12/21  1753     History Chief Complaint  Patient presents with   Chest Pain   Shortness of Breath    Charles Rollins is a 35 y.o. male with a history of tobacco use who presents to the emergency department with complaints of intermittent chest pain for the past 1 week.  Patient states he is having intermittent pain to the central chest, feels like a tightness with associated shortness of breath, nausea, and generalized paresthesias to the hands and feet.  Seems to be triggered in times of stress, no other triggering causes he can identify.  States that sometimes his Lasix to 45 minutes.  He states that if he takes some deep breaths this usually helps.  No other alleviating or aggravating factors.  Denies fever, chills, vomiting, syncope, leg pain/swelling, hemoptysis, recent surgery/trauma, recent long travel, hormone use, personal hx of cancer, or hx of DVT/PE. Denies meth/cocaine use. No symptoms at present.     HPI     No past medical history on file.  There are no problems to display for this patient.   No past surgical history on file.     No family history on file.  Social History   Tobacco Use   Smoking status: Former   Smokeless tobacco: Never  Building services engineer Use: Never used  Substance Use Topics   Alcohol use: Yes   Drug use: Yes    Types: Marijuana    Home Medications Prior to Admission medications   Medication Sig Start Date End Date Taking? Authorizing Provider  benzonatate (TESSALON) 100 MG capsule Take 1 capsule (100 mg total) by mouth every 8 (eight) hours. 02/08/19   Bill Salinas, PA-C  oxyCODONE-acetaminophen (PERCOCET/ROXICET) 5-325 MG tablet Take 1 tablet by mouth every 8 (eight) hours as needed for severe pain. 01/11/21   McDonald, Mia A, PA-C    Allergies    Patient has no known allergies.  Review of Systems   Review of  Systems  Constitutional:  Negative for chills and fever.  Respiratory:  Positive for shortness of breath.   Cardiovascular:  Positive for chest pain. Negative for leg swelling.  Gastrointestinal:  Positive for nausea. Negative for abdominal pain and vomiting.  Neurological:  Positive for numbness. Negative for syncope.  All other systems reviewed and are negative.  Physical Exam Updated Vital Signs BP 140/85   Pulse 64   Temp 98.7 F (37.1 C) (Oral)   Resp 13   SpO2 100%   Physical Exam Vitals and nursing note reviewed.  Constitutional:      General: He is not in acute distress.    Appearance: He is well-developed. He is not toxic-appearing.  HENT:     Head: Normocephalic and atraumatic.  Eyes:     General:        Right eye: No discharge.        Left eye: No discharge.     Conjunctiva/sclera: Conjunctivae normal.  Cardiovascular:     Rate and Rhythm: Normal rate and regular rhythm.     Pulses:          Radial pulses are 2+ on the right side and 2+ on the left side.       Posterior tibial pulses are 2+ on the right side and 2+ on the left side.  Pulmonary:     Effort: Pulmonary effort is normal. No respiratory  distress.     Breath sounds: Normal breath sounds. No wheezing, rhonchi or rales.  Abdominal:     General: There is no distension.     Palpations: Abdomen is soft.     Tenderness: There is no abdominal tenderness.  Musculoskeletal:     Cervical back: Neck supple.     Right lower leg: No tenderness. No edema.     Left lower leg: No tenderness. No edema.  Skin:    General: Skin is warm and dry.     Findings: No rash.  Neurological:     Mental Status: He is alert.     Comments: Clear speech.   Psychiatric:        Behavior: Behavior normal.    ED Results / Procedures / Treatments   Labs (all labs ordered are listed, but only abnormal results are displayed) Labs Reviewed  BASIC METABOLIC PANEL  CBC  DIFFERENTIAL  TROPONIN I (HIGH SENSITIVITY)  TROPONIN I  (HIGH SENSITIVITY)    EKG None  Radiology DG Chest 2 View  Result Date: 03/12/2021 CLINICAL DATA:  Chest pain, shortness of breath EXAM: CHEST - 2 VIEW COMPARISON:  Chest x-ray 10/10/2020 FINDINGS: The heart size and mediastinal contours are within normal limits. Both lungs are clear. The visualized skeletal structures are unremarkable. IMPRESSION: No active cardiopulmonary disease. Electronically Signed   By: Tish Frederickson M.D.   On: 03/12/2021 19:09    Procedures Procedures   Medications Ordered in ED Medications - No data to display  ED Course  I have reviewed the triage vital signs and the nursing notes.  Pertinent labs & imaging results that were available during my care of the patient were reviewed by me and considered in my medical decision making (see chart for details).    MDM Rules/Calculators/A&P                           Patient presents to the emergency department with chest pain. Patient nontoxic appearing, in no apparent distress, vitals without significant abnormality, intermittent mild bradycardia- normal HR on my exam.   DDX: ACS, pulmonary embolism, dissection, pneumothorax, pneumonia, arrhythmia, severe anemia, MSK, GERD, anxiety.   Additional history obtained:  Additional history obtained from chart review & nursing note review.   EKG: No STEMI  Lab Tests:  I reviewed and interpreted labs, which included:  CBC: Unremarkable.  BMP: Unremarkable Troponin: WNL, flat delta.   Imaging Studies ordered:  I ordered imaging studies which included CXR, I independently reviewed, formal radiology impression shows: No active cardiopulmonary disease.  ED Course:   Heart Pathway Score low risk- EKG without obvious acute ischemia, delta troponin negative, doubt ACS. Patient is low risk wells, PERC negative, doubt pulmonary embolism. Pain is not a tearing sensation, symmetric pulses, no widening of mediastinum on CXR, doubt dissection. Cardiac monitor reviewed, no  notable arrhythmias or tachycardia. Reassuring labs/x-ray. Patient has appeared hemodynamically stable throughout ER visit and appears safe for discharge with close PCP/cardiology follow up. Will trial atarax given stress seems to induce sxs at times. I discussed results, treatment plan, need for follow-up, and return precautions with the patient. Provided opportunity for questions, patient confirmed understanding and is in agreement with plan.   Portions of this note were generated with Scientist, clinical (histocompatibility and immunogenetics). Dictation errors may occur despite best attempts at proofreading.  Final Clinical Impression(s) / ED Diagnoses Final diagnoses:  Chest pain, unspecified type    Rx / DC Orders ED  Discharge Orders          Ordered    hydrOXYzine (ATARAX/VISTARIL) 25 MG tablet  Every 8 hours PRN        03/13/21 0348             Cherly Anderson, PA-C 03/13/21 0350    Molpus, Jonny Ruiz, MD 03/13/21 1046

## 2021-03-13 NOTE — ED Notes (Signed)
Lab just now processing troponin.

## 2021-08-08 ENCOUNTER — Ambulatory Visit: Payer: Self-pay | Admitting: *Deleted

## 2021-08-08 NOTE — Telephone Encounter (Signed)
Chief Complaint: covid sx requesting advise , no PCP Symptoms: cough yellow and green sputum, chills, difficulty breathing through nose stuffy nose.  Frequency: 2 days ago  Pertinent Negatives: Patient denies chest pain , difficulty breathing other than through nose. Disposition: [] ED /[] Urgent Care (no appt availability in office) / [] Appointment(In office/virtual)/ []  [facility]/ [x] Home Care/ [] Refused Recommended Disposition /[x] [facility]/ []  Follow-up with PCP Additional Notes:   Recommended to take at home covid test tomorrow and if positive go to or [facility] walkin clinic . If symptoms of difficulty breathing through nose worsen go to ED. Patient verbalized understanding and to increase fluid intake and use OTC medication for symptoms.    Reason for Disposition  [1] COVID-19 infection suspected by caller or triager AND [2] mild symptoms (cough, fever, or others) AND [3] has not gotten tested yet  Answer Assessment - Initial Assessment Questions 1. COVID-19 DIAGNOSIS: "Who made your COVID-19 diagnosis?" "Was it confirmed by a positive lab test or self-test?" If not diagnosed by a doctor (or NP/PA), ask "Are there lots of cases (community spread) where you live?" Note: See public health department website, if unsure.     Has not been tested  2. COVID-19 EXPOSURE: "Was there any known exposure to COVID before the symptoms began?" CDC Definition of close contact: within 6 feet (2 meters) for a total of 15 minutes or more over a 24-hour period.      No  3. ONSET: "When did the COVID-19 symptoms start?"      2 days ago  4. WORST SYMPTOM: "What is your worst symptom?" (e.g., cough, fever, shortness of breath, muscle aches)     Difficulty breathing through nose due to stuffy nose  5. COUGH: "Do you have a cough?" If Yes, ask: "How bad is the cough?"       Yes productive , yellow and green sputum 6. FEVER: "Do you have a fever?" If Yes, ask: "What is your  temperature, how was it measured, and when did it start?"     Denies does have chills 7. RESPIRATORY STATUS: "Describe your breathing?" (e.g., shortness of breath, wheezing, unable to speak)      Able to speak stuffy nose  8. BETTER-SAME-WORSE: "Are you getting better, staying the same or getting worse compared to yesterday?"  If getting worse, ask, "In what way?"     na 9. HIGH RISK DISEASE: "Do you have any chronic medical problems?" (e.g., asthma, heart or lung disease, weak immune system, obesity, etc.)     No  10. VACCINE: "Have you had the COVID-19 vaccine?" If Yes, ask: "Which one, how many shots, when did you get it?"       na 11. BOOSTER: "Have you received your COVID-19 booster?" If Yes, ask: "Which one and when did you get it?"       na 12. PREGNANCY: "Is there any chance you are pregnant?" "When was your last menstrual period?"       na 13. OTHER SYMPTOMS: "Do you have any other symptoms?"  (e.g., chills, fatigue, headache, loss of smell or taste, muscle pain, sore throat)       Chills, weakness, feels like cant breath due to stuffy nose  14. O2 SATURATION MONITOR:  "Do you use an oxygen saturation monitor (pulse oximeter) at home?" If Yes, ask "What is your reading (oxygen level) today?" "What is your usual oxygen saturation reading?" (e.g., 95%)       na  Protocols used: Coronavirus (FOYDX-41) Diagnosed or Suspected-A-AH

## 2022-04-11 ENCOUNTER — Emergency Department (HOSPITAL_COMMUNITY)
Admission: EM | Admit: 2022-04-11 | Discharge: 2022-04-12 | Discharge status: Against Medical Advice | Payer: Self-pay | Attending: Student | Admitting: Student

## 2022-04-11 DIAGNOSIS — K920 Hematemesis: Secondary | ICD-10-CM | POA: Insufficient documentation

## 2022-04-11 DIAGNOSIS — R0602 Shortness of breath: Secondary | ICD-10-CM | POA: Insufficient documentation

## 2022-04-11 DIAGNOSIS — R0789 Other chest pain: Secondary | ICD-10-CM | POA: Insufficient documentation

## 2022-04-11 DIAGNOSIS — Z5321 Procedure and treatment not carried out due to patient leaving prior to being seen by health care provider: Secondary | ICD-10-CM | POA: Insufficient documentation

## 2022-04-11 NOTE — ED Notes (Signed)
Pt called for triage, no response. 

## 2022-04-12 ENCOUNTER — Emergency Department (HOSPITAL_COMMUNITY): Payer: Self-pay

## 2022-04-12 ENCOUNTER — Encounter (HOSPITAL_COMMUNITY): Payer: Self-pay

## 2022-04-12 ENCOUNTER — Other Ambulatory Visit: Payer: Self-pay

## 2022-04-12 LAB — CBC WITH DIFFERENTIAL/PLATELET
Abs Immature Granulocytes: 0.01 10*3/uL (ref 0.00–0.07)
Basophils Absolute: 0.1 10*3/uL (ref 0.0–0.1)
Basophils Relative: 1 %
Eosinophils Absolute: 0.5 10*3/uL (ref 0.0–0.5)
Eosinophils Relative: 5 %
HCT: 49.8 % (ref 39.0–52.0)
Hemoglobin: 17.6 g/dL — ABNORMAL HIGH (ref 13.0–17.0)
Immature Granulocytes: 0 %
Lymphocytes Relative: 22 %
Lymphs Abs: 2.1 10*3/uL (ref 0.7–4.0)
MCH: 32.1 pg (ref 26.0–34.0)
MCHC: 35.3 g/dL (ref 30.0–36.0)
MCV: 90.9 fL (ref 80.0–100.0)
Monocytes Absolute: 1 10*3/uL (ref 0.1–1.0)
Monocytes Relative: 11 %
Neutro Abs: 5.7 10*3/uL (ref 1.7–7.7)
Neutrophils Relative %: 61 %
Platelets: 235 10*3/uL (ref 150–400)
RBC: 5.48 MIL/uL (ref 4.22–5.81)
RDW: 12.6 % (ref 11.5–15.5)
WBC: 9.5 10*3/uL (ref 4.0–10.5)
nRBC: 0 % (ref 0.0–0.2)

## 2022-04-12 LAB — D-DIMER, QUANTITATIVE: D-Dimer, Quant: <0.27 ug/mL-FEU (ref 0.00–0.50)

## 2022-04-12 LAB — TROPONIN I (HIGH SENSITIVITY): Troponin I (High Sensitivity): 4 ng/L (ref <18)

## 2022-04-12 LAB — BASIC METABOLIC PANEL
Anion gap: 11 (ref 5–15)
BUN: 12 mg/dL (ref 6–20)
CO2: 22 mmol/L (ref 22–32)
Calcium: 9.5 mg/dL (ref 8.9–10.3)
Chloride: 104 mmol/L (ref 98–111)
Creatinine, Ser: 1.17 mg/dL (ref 0.61–1.24)
GFR, Estimated: >60 mL/min (ref >60)
Glucose, Bld: 114 mg/dL — ABNORMAL HIGH (ref 70–99)
Potassium: 3.9 mmol/L (ref 3.5–5.1)
Sodium: 137 mmol/L (ref 135–145)

## 2022-04-12 MED ORDER — ALBUTEROL SULFATE HFA 108 (90 BASE) MCG/ACT IN AERS
2.0000 | INHALATION_SPRAY | Freq: Once | RESPIRATORY_TRACT | Status: AC
  Administered 2022-04-12: 2 via RESPIRATORY_TRACT
  Filled 2022-04-12: qty 6.7

## 2022-04-12 NOTE — ED Notes (Signed)
Pt. Came up and stated, "I have to leave."  Pt. Has children and work.  Encouraged pt. To stay.  He verbalized he will come back if needed.  Pt. Denies any sob or chest pain

## 2022-04-12 NOTE — ED Triage Notes (Signed)
Reports shob, cough, and mild sternal chest pains for several days. Noticed small amount of blood when he coughs.

## 2022-04-12 NOTE — ED Provider Triage Note (Signed)
Emergency Medicine Provider Triage Evaluation Note  Charles Rollins , a 36 y.o. male  was evaluated in triage.  Pt complains of SOB as well as chest tightness, started about 2 days ago, has remote history of asthma, states that he is have difficulty breathing at nighttime states he has some slight coughing denies it being productive, he notes that he has had hemoptysis, this was not after violently coughing or after a long string of coughing, states he noted it yesterday, he notes that he will have some pleuritic chest pain currently does not have this time, no history PEs or DVTs he is not on hormone therapy no recent surgeries along immobilizations, currently not on albuterol at this time no cardiac history.  Review of Systems  Positive: Cough, chest tightness Negative: Leg swelling calf pain  Physical Exam  BP (!) 141/92 (BP Location: Right Arm)   Pulse 90   Temp 98.6 F (37 C) (Oral)   Resp (!) 24   SpO2 95%  Gen:   Awake, no distress   Resp:  Normal effort  MSK:   Moves extremities without difficulty  Other:  No evidence of respiratory distress speaking full sentences, no assessor muscle usage, has expiratory wheezing bilaterally no rales or rhonchi noted.  Medical Decision Making  Medically screening exam initiated at 1:00 AM.  Appropriate orders placed.  Charles Rollins was informed that the remainder of the evaluation will be completed by another provider, this initial triage assessment does not replace that evaluation, and the importance of remaining in the ED until their evaluation is complete.  Lab work imaging ordered will need further work-up.   Marcello Fennel, PA-C 04/12/22 [phone]

## 2022-06-06 ENCOUNTER — Encounter (HOSPITAL_COMMUNITY): Payer: Self-pay | Admitting: Emergency Medicine

## 2022-06-06 ENCOUNTER — Emergency Department (HOSPITAL_COMMUNITY): Payer: Self-pay

## 2022-06-06 ENCOUNTER — Emergency Department (HOSPITAL_COMMUNITY)
Admission: EM | Admit: 2022-06-06 | Discharge: 2022-06-06 | Disposition: A | Discharge status: Home / Self Care | Payer: Self-pay | Attending: Emergency Medicine | Admitting: Emergency Medicine

## 2022-06-06 DIAGNOSIS — R059 Cough, unspecified: Secondary | ICD-10-CM | POA: Insufficient documentation

## 2022-06-06 DIAGNOSIS — Z1152 Encounter for screening for COVID-19: Secondary | ICD-10-CM | POA: Insufficient documentation

## 2022-06-06 DIAGNOSIS — R0789 Other chest pain: Secondary | ICD-10-CM

## 2022-06-06 DIAGNOSIS — J45909 Unspecified asthma, uncomplicated: Secondary | ICD-10-CM | POA: Insufficient documentation

## 2022-06-06 DIAGNOSIS — R0602 Shortness of breath: Secondary | ICD-10-CM | POA: Insufficient documentation

## 2022-06-06 HISTORY — DX: Unspecified asthma, uncomplicated: J45.909

## 2022-06-06 LAB — RESP PANEL BY RT-PCR (RSV, FLU A&B, COVID)  RVPGX2
Influenza A by PCR: NEGATIVE
Influenza B by PCR: NEGATIVE
Resp Syncytial Virus by PCR: NEGATIVE
SARS Coronavirus 2 by RT PCR: NEGATIVE

## 2022-06-06 LAB — BASIC METABOLIC PANEL
Anion gap: 10 (ref 5–15)
BUN: 12 mg/dL (ref 6–20)
CO2: 22 mmol/L (ref 22–32)
Calcium: 9.2 mg/dL (ref 8.9–10.3)
Chloride: 103 mmol/L (ref 98–111)
Creatinine, Ser: 1.04 mg/dL (ref 0.61–1.24)
GFR, Estimated: >60 mL/min (ref >60)
Glucose, Bld: 116 mg/dL — ABNORMAL HIGH (ref 70–99)
Potassium: 3.7 mmol/L (ref 3.5–5.1)
Sodium: 135 mmol/L (ref 135–145)

## 2022-06-06 LAB — CBC
HCT: 48.7 % (ref 39.0–52.0)
Hemoglobin: 16.6 g/dL (ref 13.0–17.0)
MCH: 31.2 pg (ref 26.0–34.0)
MCHC: 34.1 g/dL (ref 30.0–36.0)
MCV: 91.5 fL (ref 80.0–100.0)
Platelets: 261 10*3/uL (ref 150–400)
RBC: 5.32 MIL/uL (ref 4.22–5.81)
RDW: 12.6 % (ref 11.5–15.5)
WBC: 10.3 10*3/uL (ref 4.0–10.5)
nRBC: 0 % (ref 0.0–0.2)

## 2022-06-06 LAB — TROPONIN I (HIGH SENSITIVITY): Troponin I (High Sensitivity): 4 ng/L (ref <18)

## 2022-06-06 MED ORDER — PREDNISONE 20 MG PO TABS: 40.0000 mg | ORAL_TABLET | Freq: Every day | ORAL | 0 refills | Status: AC

## 2022-06-06 MED ORDER — PREDNISONE 20 MG PO TABS: 40.0000 mg | ORAL_TABLET | Freq: Every day | ORAL | 0 refills | Status: DC

## 2022-06-06 MED ORDER — PREDNISONE 20 MG PO TABS
60.0000 mg | ORAL_TABLET | ORAL | Status: AC
  Administered 2022-06-06: 60 mg via ORAL
  Filled 2022-06-06: qty 3

## 2022-06-06 MED ORDER — ALBUTEROL SULFATE HFA 108 (90 BASE) MCG/ACT IN AERS
2.0000 | INHALATION_SPRAY | Freq: Four times a day (QID) | RESPIRATORY_TRACT | Status: DC
  Administered 2022-06-06: 2 via RESPIRATORY_TRACT
  Filled 2022-06-06: qty 6.7

## 2022-06-06 NOTE — ED Notes (Signed)
Pt d/c home per MD order. Discharge summary reviewed, pt verbalizes understanding. Ambulatory off unit. No s/s of acute distress noted at discharge.  °

## 2022-06-06 NOTE — ED Provider Triage Note (Signed)
Emergency Medicine Provider Triage Evaluation Note  Charles Rollins , a 36 y.o. male  was evaluated in triage.  Pt complains of cough, sob, cp.  In spite of using albuterol provided 3 weeks ago he has had worsening dyspnea episodically, with occasional chest tightness, no syncope, no fever  Review of Systems  Positive: HPI Negative: Vomiting, fever, persistent chest pain, exertional chest pain  Physical Exam  BP (!) 157/97 (BP Location: Right Arm)   Pulse 97   Temp 98.1 F (36.7 C) (Oral)   Resp 20   Ht 6' (1.829 m)   Wt 77 kg   SpO2 96%   BMI 23.02 kg/m  Gen:   Awake, no distress speaking clearly Resp:  Normal effort tachypnea MSK:   Moves extremities without difficulty no deformities Other:  Neuro awake and alert  Medical Decision Making  Medically screening exam initiated at 8:25 AM.  Appropriate orders placed.  Charles Rollins was informed that the remainder of the evaluation will be completed by another provider, this initial triage assessment does not replace that evaluation, and the importance of remaining in the ED until their evaluation is complete.   Gerhard Munch, MD 06/06/22 [phone]

## 2022-06-06 NOTE — ED Triage Notes (Signed)
Pt reports central CP and SOB for a week. Also reports soreness in abd. Reports hx of asthma but is out of inhaler.

## 2022-06-06 NOTE — Discharge Instructions (Signed)
Please use the provided albuterol No. 4 hours for the next 2 days.  You may then use it as needed.  Please obtain and take the steroids you have been prescribed.  Use the provided information above to follow-up at our affiliated community care center.

## 2022-06-06 NOTE — ED Provider Notes (Signed)
[facility] EMERGENCY DEPARTMENT Provider Note   CSN: [id number] Arrival date & time: 06/06/22  0805     History  Chief Complaint  Patient presents with   Chest Pain    Charles Rollins is a 36 y.o. male.  HPI Patient presents with cough shortness of breath chest pain.  Patient was provided albuterol a bit few weeks ago, but ran out, now presents with concern for episodic symptoms.  No fever, no syncope he has not followed up with primary care    Home Medications Prior to Admission medications   Medication Sig Start Date End Date Taking? Authorizing Provider  predniSONE (DELTASONE) 20 MG tablet Take 2 tablets (40 mg total) by mouth daily with breakfast. For the next four days 06/06/22  Yes Gerhard Munch, MD  benzonatate (TESSALON) 100 MG capsule Take 1 capsule (100 mg total) by mouth every 8 (eight) hours. 02/08/19   Harlene Salts A, PA-C  hydrOXYzine (ATARAX/VISTARIL) 25 MG tablet Take 1 tablet (25 mg total) by mouth every 8 (eight) hours as needed for anxiety or nausea. 03/13/21   Petrucelli, Pleas Koch, PA-C  oxyCODONE-acetaminophen (PERCOCET/ROXICET) 5-325 MG tablet Take 1 tablet by mouth every 8 (eight) hours as needed for severe pain. 01/11/21   McDonald, Mia A, PA-C      Allergies    Patient has no known allergies.    Review of Systems   Review of Systems  All other systems reviewed and are negative.   Physical Exam Updated Vital Signs BP 124/83   Pulse 82   Temp 98.4 F (36.9 C) (Oral)   Resp 17   Ht 6' (1.829 m)   Wt 77 kg   SpO2 97%   BMI 23.02 kg/m  Physical Exam Vitals and nursing note reviewed.  Constitutional:      General: He is not in acute distress.    Appearance: He is well-developed.  HENT:     Head: Normocephalic and atraumatic.  Eyes:     Conjunctiva/sclera: Conjunctivae normal.  Cardiovascular:     Rate and Rhythm: Normal rate and regular rhythm.  Pulmonary:     Effort: Pulmonary effort is normal. No  respiratory distress.     Breath sounds: No stridor.  Abdominal:     General: There is no distension.  Skin:    General: Skin is warm and dry.  Neurological:     Mental Status: He is alert and oriented to person, place, and time.     ED Results / Procedures / Treatments   Labs (all labs ordered are listed, but only abnormal results are displayed) Labs Reviewed  BASIC METABOLIC PANEL - Abnormal; Notable for the following components:      Result Value   Glucose, Bld 116 (*)    All other components within normal limits  RESP PANEL BY RT-PCR (RSV, FLU A&B, COVID)  RVPGX2  CBC  TROPONIN I (HIGH SENSITIVITY)  TROPONIN I (HIGH SENSITIVITY)    EKG EKG Interpretation  Date/Time:  Wednesday June 06 2022 08:13:44 EST Ventricular Rate:  100 PR Interval:  142 QRS Duration: 94 QT Interval:  298 QTC Calculation: 384 R Axis:   85 Text Interpretation: Normal sinus rhythm Rightward axis Abnormal ECG Confirmed by Gerhard Munch [id number]) on 06/06/2022 8:17:04 AM  Radiology DG Chest 2 View  Result Date: 06/06/2022 CLINICAL DATA:  Chest pain, shortness of breath EXAM: CHEST - 2 VIEW COMPARISON:  04/12/2022 FINDINGS: The heart size and mediastinal contours are within normal limits. Both  lungs are clear. The visualized skeletal structures are unremarkable. IMPRESSION: Normal chest radiographs. Electronically Signed   By: Duanne Guess D.O.   On: 06/06/2022 08:38    Procedures Procedures    Medications Ordered in ED Medications  albuterol (VENTOLIN HFA) 108 (90 Base) MCG/ACT inhaler 2 puff (has no administration in time range)  predniSONE (DELTASONE) tablet 60 mg (has no administration in time range)    ED Course/ Medical Decision Making/ A&P                           Medical Decision Making Generally well-appearing young male with history of asthma presents with intermittent chest pain, is awake, alert, afebrile, hemodynamically unremarkable.  Differential including atypical  ACS, viral infection, bronchitis, pneumonia, with x-ray, labs ordered from triage  Amount and/or Complexity of Data Reviewed External Data Reviewed: notes. Labs: ordered. Decision-making details documented in ED Course. Radiology: ordered. Decision-making details documented in ED Course. ECG/medicine tests:  Decision-making details documented in ED Course.  Risk Prescription drug management. Decision regarding hospitalization.   12:01 PM Patient awake, alert, sitting upright, in no distress.  Labs reviewed, discussed, no evidence for RSV, influenza, COVID.  No evidence for pneumonia on chest x-ray, ECG reassuring, little evidence for ACS with reassuring troponin, and ECG nonischemic, heart score of 1        Final Clinical Impression(s) / ED Diagnoses Final diagnoses:  Atypical chest pain    Rx / DC Orders ED Discharge Orders          Ordered    predniSONE (DELTASONE) 20 MG tablet  Daily with breakfast        06/06/22 1200              Gerhard Munch, MD 06/06/22 1201

## 2022-08-03 IMAGING — CR DG FOOT COMPLETE 3+V*R*
3 series · 3 of 3 positions shown · non-contrast
Comparison: None.

CLINICAL DATA: Foot injury

EXAM:
RIGHT FOOT COMPLETE - 3+ VIEW

[foot ap]
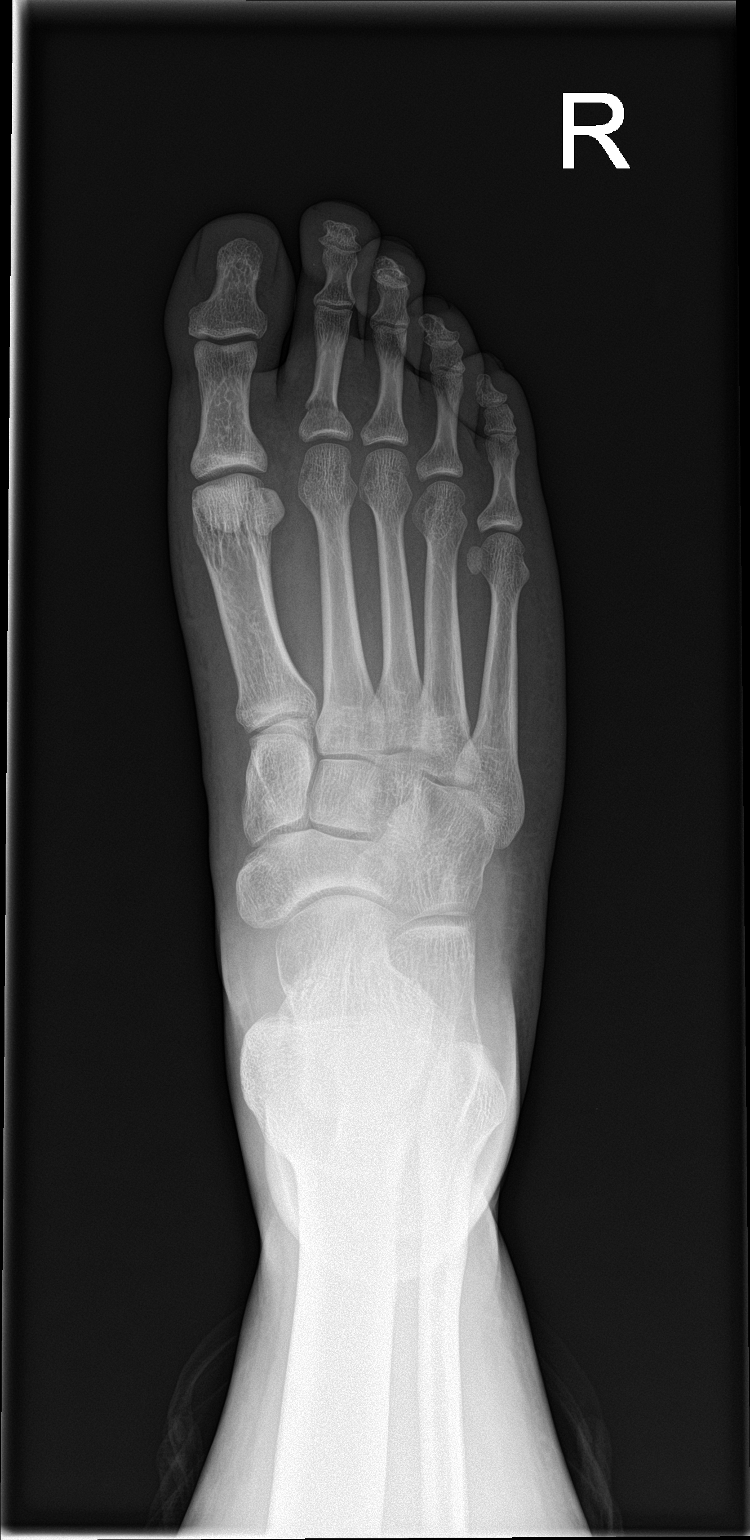

[foot obl]
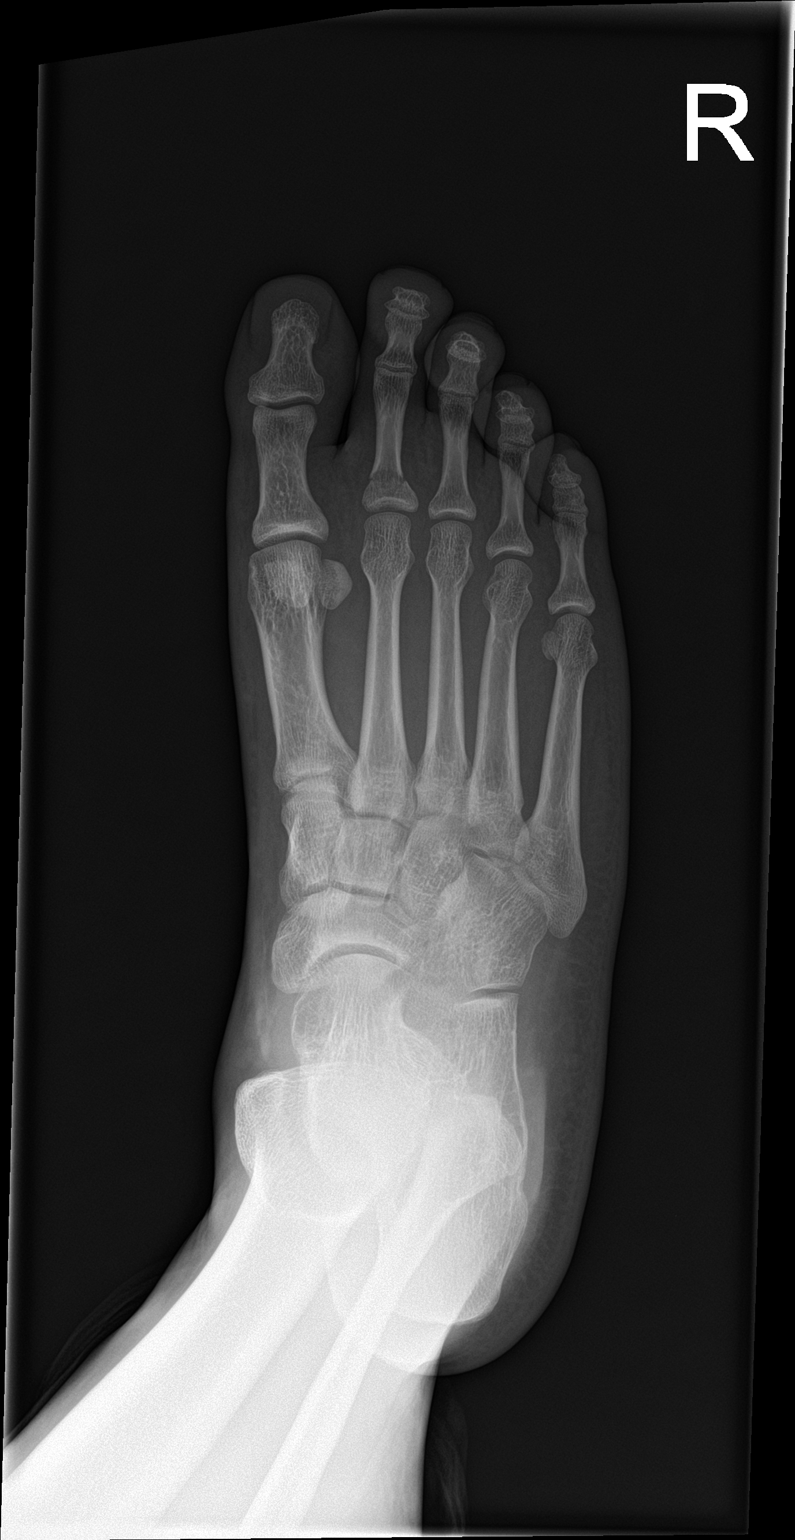

[foot lat]
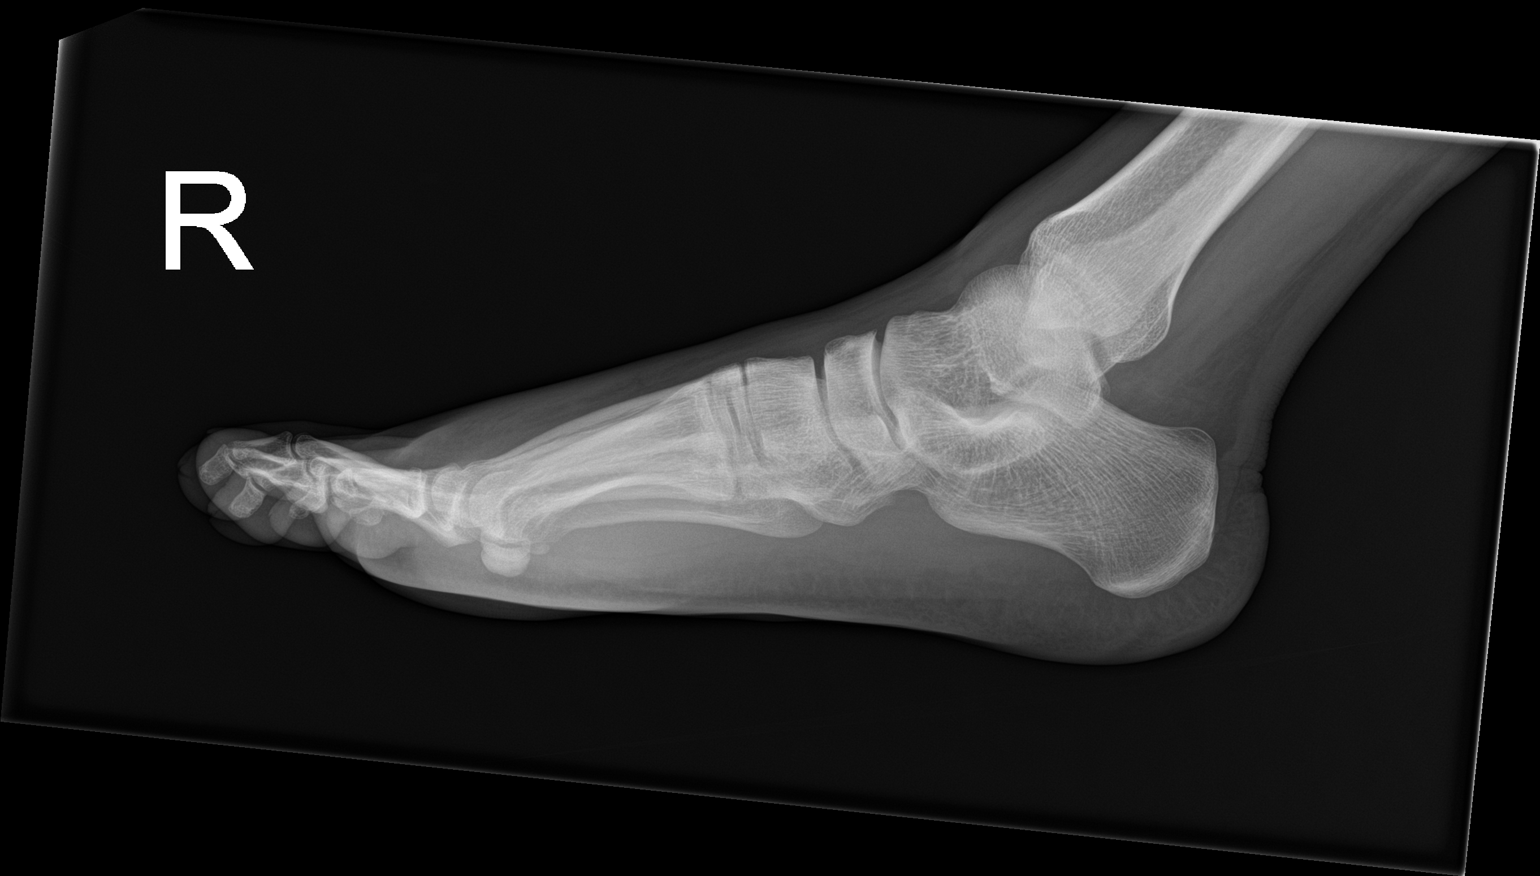

[3 of 3 positions shown; findings below may reference images not displayed]

FINDINGS: There is no evidence of fracture or dislocation. There is no
evidence of arthropathy or other focal bone abnormality. Soft
tissues are unremarkable.
IMPRESSION: Negative.

## 2022-10-03 IMAGING — CR DG CHEST 2V
2 series · 2 of 2 positions shown · non-contrast
Comparison: Chest x-ray 10/10/2020

CLINICAL DATA: Chest pain, shortness of breath

EXAM:
CHEST - 2 VIEW

[w chest pa]
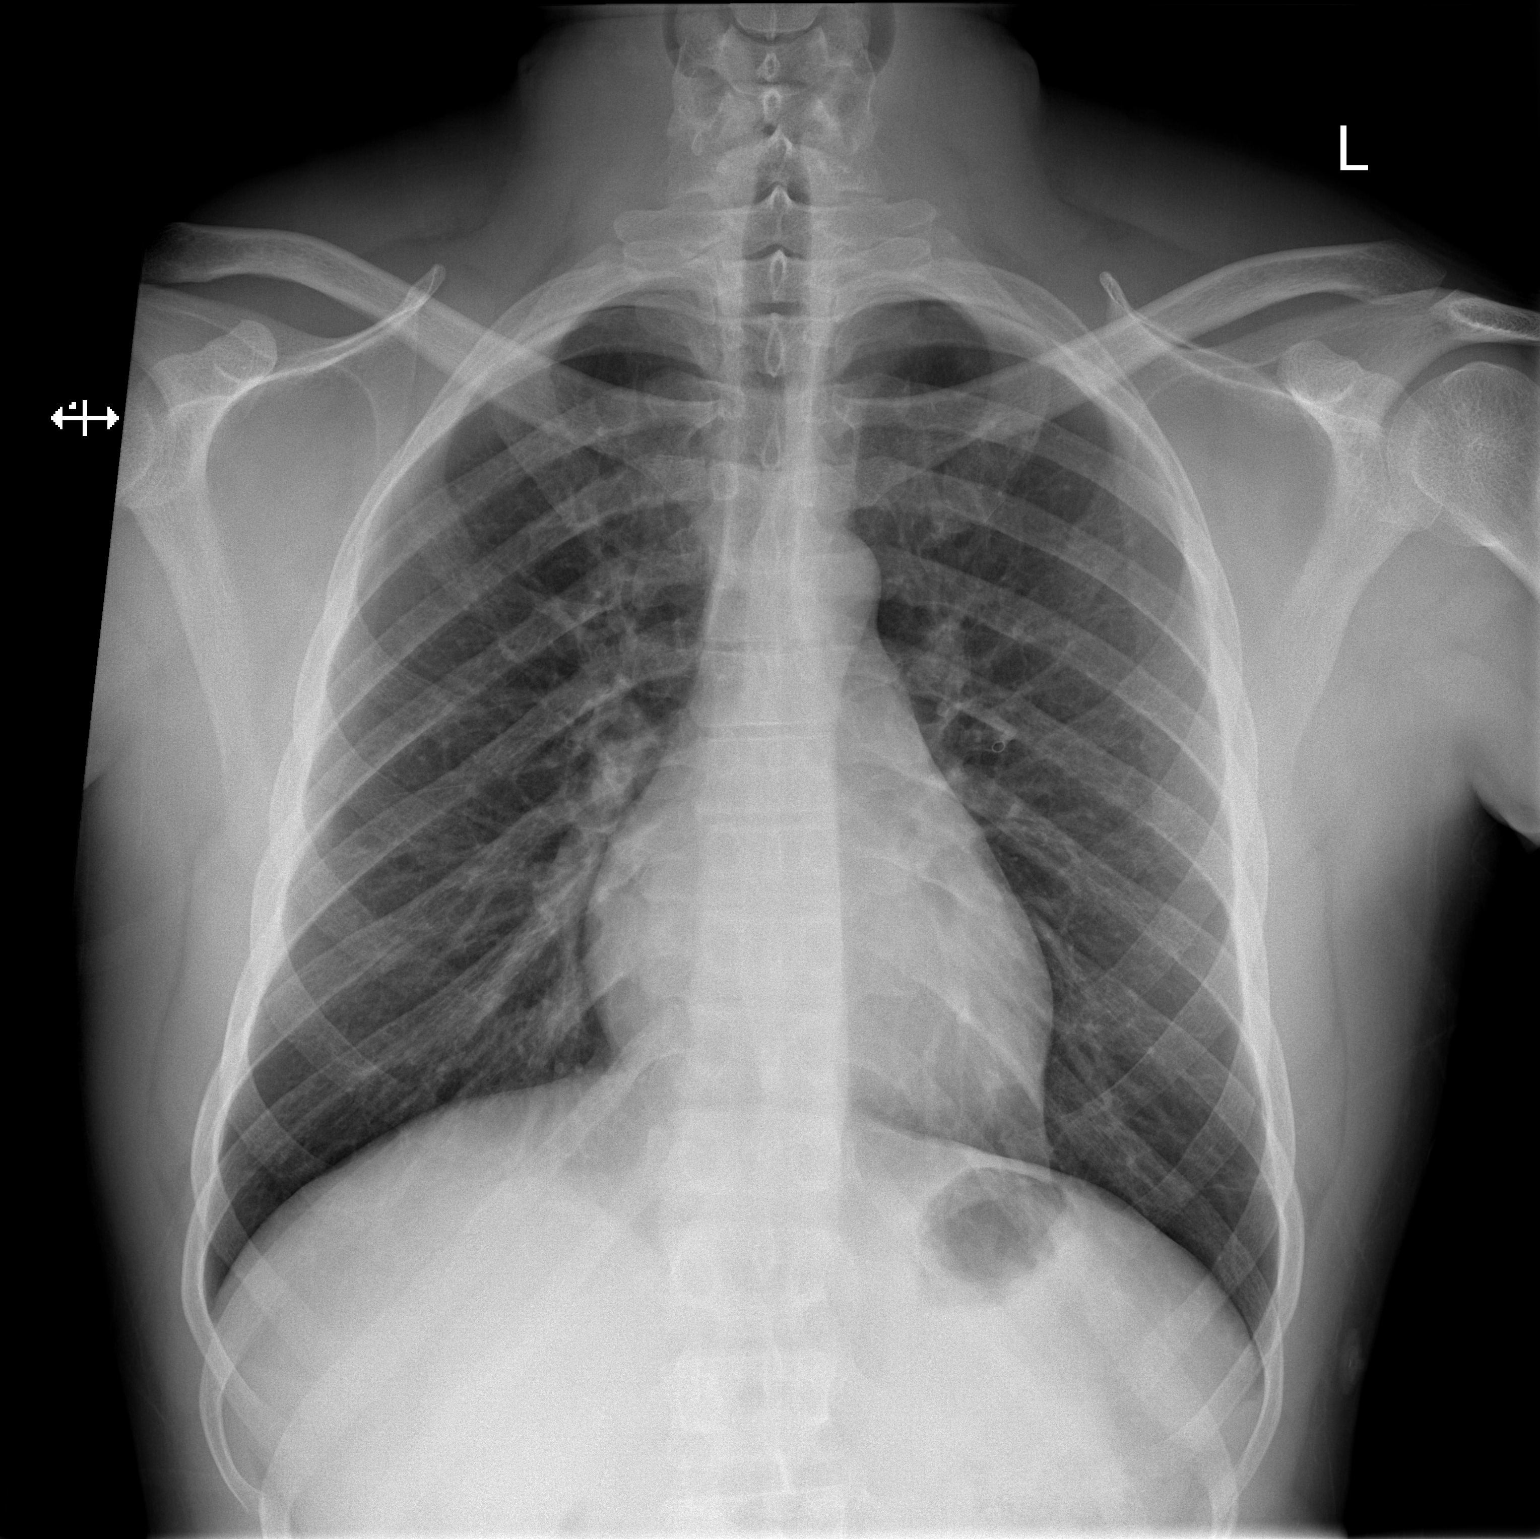

[w chest lat]
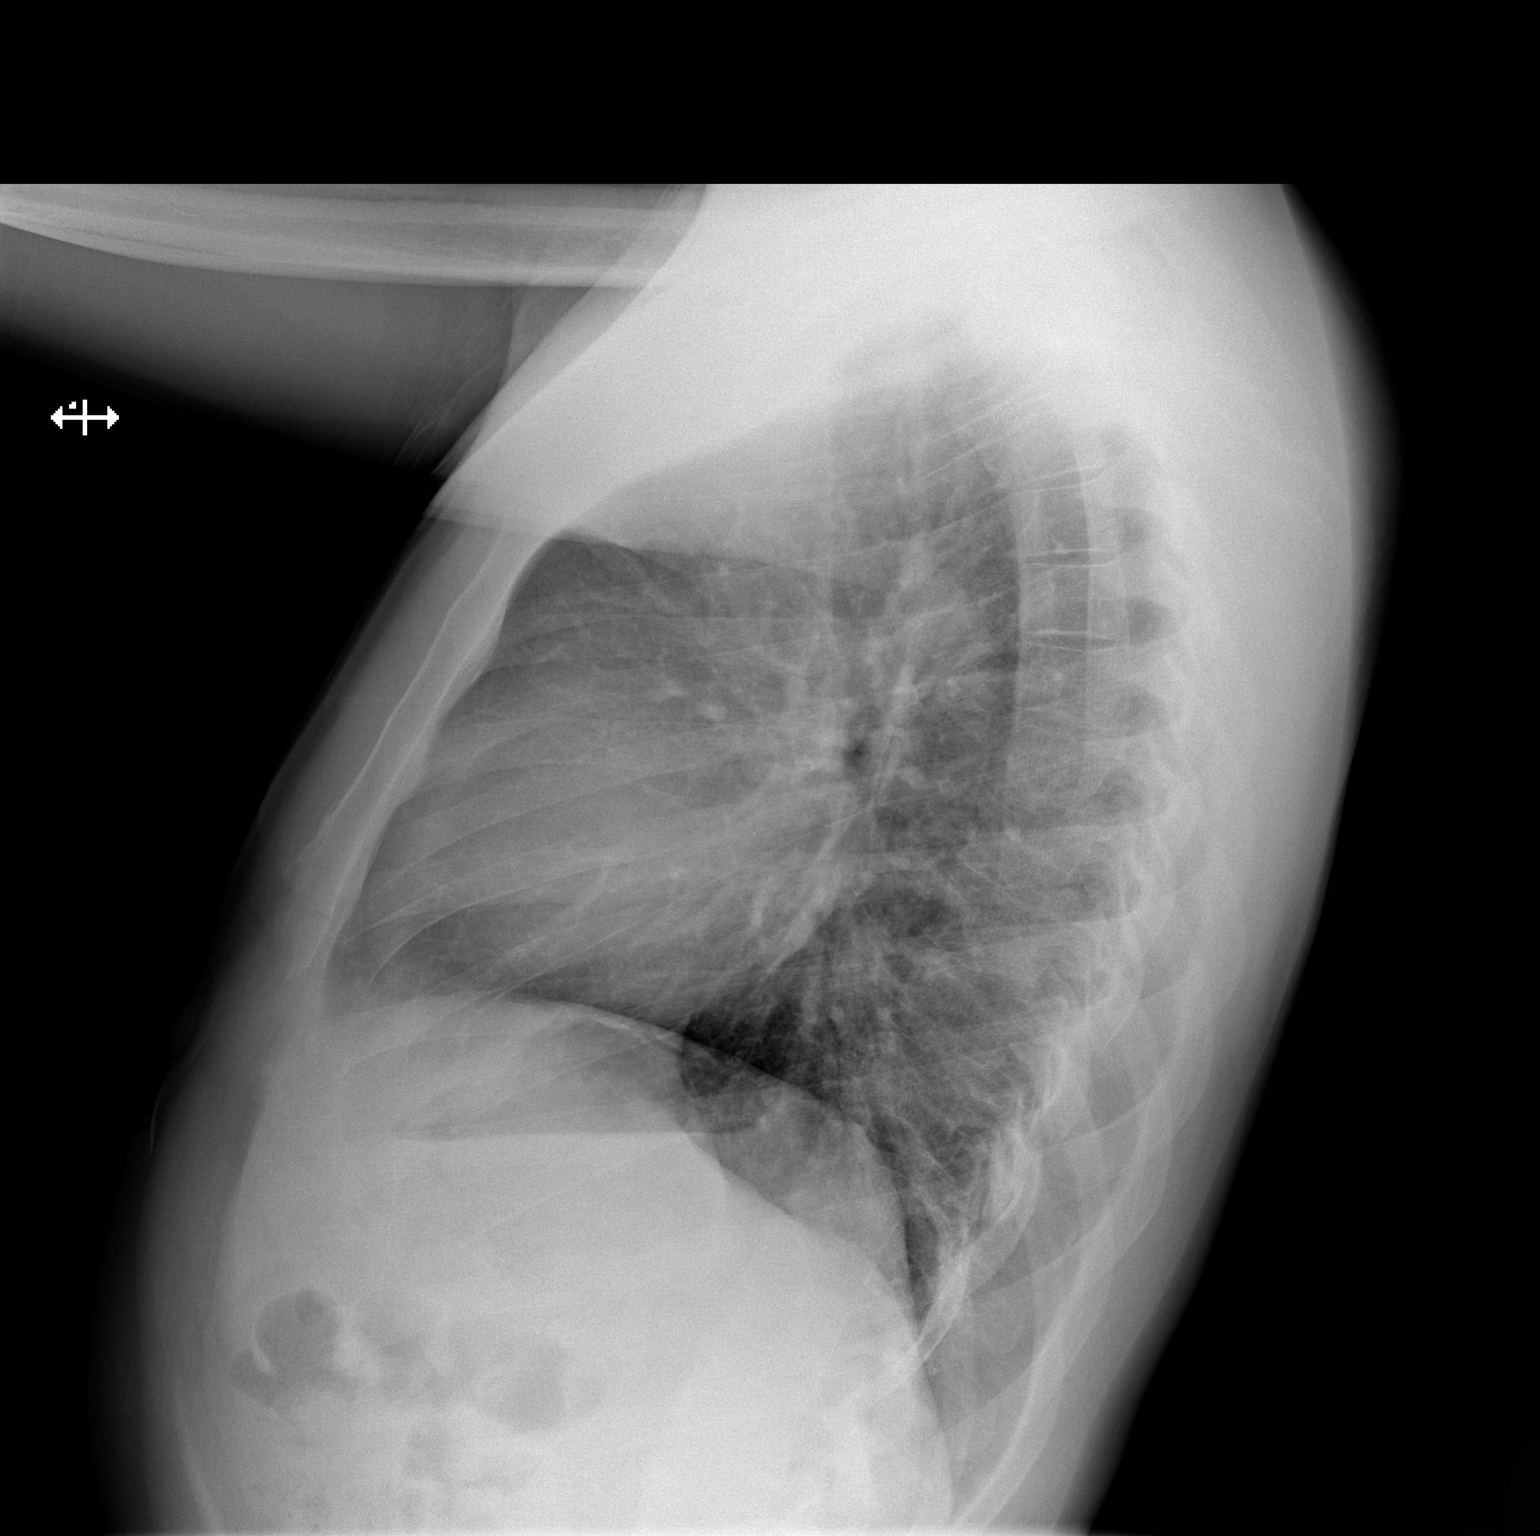

[2 of 2 positions shown; findings below may reference images not displayed]

FINDINGS: The heart size and mediastinal contours are within normal limits.
Both lungs are clear. The visualized skeletal structures are
unremarkable.
IMPRESSION: No active cardiopulmonary disease.

## 2022-10-22 ENCOUNTER — Emergency Department (HOSPITAL_COMMUNITY): Payer: Self-pay

## 2022-10-22 ENCOUNTER — Other Ambulatory Visit: Payer: Self-pay

## 2022-10-22 ENCOUNTER — Emergency Department (HOSPITAL_COMMUNITY)
Admission: EM | Admit: 2022-10-22 | Discharge: 2022-10-22 | Disposition: A | Discharge status: Home / Self Care | Payer: Self-pay | Attending: Emergency Medicine | Admitting: Emergency Medicine

## 2022-10-22 ENCOUNTER — Encounter (HOSPITAL_COMMUNITY): Payer: Self-pay

## 2022-10-22 DIAGNOSIS — R079 Chest pain, unspecified: Secondary | ICD-10-CM | POA: Insufficient documentation

## 2022-10-22 DIAGNOSIS — F17200 Nicotine dependence, unspecified, uncomplicated: Secondary | ICD-10-CM | POA: Insufficient documentation

## 2022-10-22 DIAGNOSIS — J45909 Unspecified asthma, uncomplicated: Secondary | ICD-10-CM | POA: Insufficient documentation

## 2022-10-22 DIAGNOSIS — R059 Cough, unspecified: Secondary | ICD-10-CM | POA: Insufficient documentation

## 2022-10-22 DIAGNOSIS — R519 Headache, unspecified: Secondary | ICD-10-CM | POA: Insufficient documentation

## 2022-10-22 LAB — BASIC METABOLIC PANEL
Anion gap: 7 (ref 5–15)
BUN: 8 mg/dL (ref 6–20)
CO2: 24 mmol/L (ref 22–32)
Calcium: 8.5 mg/dL — ABNORMAL LOW (ref 8.9–10.3)
Chloride: 108 mmol/L (ref 98–111)
Creatinine, Ser: 1 mg/dL (ref 0.61–1.24)
GFR, Estimated: >60 mL/min (ref >60)
Glucose, Bld: 160 mg/dL — ABNORMAL HIGH (ref 70–99)
Potassium: 3.7 mmol/L (ref 3.5–5.1)
Sodium: 139 mmol/L (ref 135–145)

## 2022-10-22 LAB — TROPONIN I (HIGH SENSITIVITY): Troponin I (High Sensitivity): <2 ng/L (ref <18)

## 2022-10-22 NOTE — ED Triage Notes (Signed)
Pt arrives today c/o chest pain and headache since Friday. Pt also states he has palpable nodules throughout his abdomen and back. Pt states he has been SHOB.  Pt in NAD, able to speak in full sentences without difficulty.

## 2022-10-22 NOTE — ED Provider Notes (Signed)
[place] EMERGENCY DEPARTMENT AT [facility] Provider Note   CSN: [id number] Arrival date & time: 10/22/22  4098     History  Chief Complaint  Patient presents with   Chest Pain    Charles Rollins is a 37 y.o. male.   Chest Pain Patient presents with chest pain and headache.  Feeling bad.  Has had a little bit of a cough.  Pain not exertional.  No definite sick contacts.  States it feels tight in his chest.  Does smoke.  No known cardiac disease.  Has had for the last 4 days now.    Past Medical History:  Diagnosis Date   Asthma     Home Medications Prior to Admission medications   Medication Sig Start Date End Date Taking? Authorizing Provider  benzonatate (TESSALON) 100 MG capsule Take 1 capsule (100 mg total) by mouth every 8 (eight) hours. 02/08/19   Harlene Salts A, PA-C  hydrOXYzine (ATARAX/VISTARIL) 25 MG tablet Take 1 tablet (25 mg total) by mouth every 8 (eight) hours as needed for anxiety or nausea. 03/13/21   Petrucelli, Pleas Koch, PA-C  oxyCODONE-acetaminophen (PERCOCET/ROXICET) 5-325 MG tablet Take 1 tablet by mouth every 8 (eight) hours as needed for severe pain. 01/11/21   McDonald, Mia A, PA-C  predniSONE (DELTASONE) 20 MG tablet Take 2 tablets (40 mg total) by mouth daily with breakfast. For the next four days 06/06/22   Gerhard Munch, MD      Allergies    Patient has no known allergies.    Review of Systems   Review of Systems  Cardiovascular:  Positive for chest pain.    Physical Exam Updated Vital Signs BP 125/83 (BP Location: Left Arm)   Pulse 70   Temp 98.4 F (36.9 C) (Oral)   Resp 18   Ht 6' (1.829 m)   Wt 85.3 kg   SpO2 98%   BMI 25.50 kg/m  Physical Exam Vitals and nursing note reviewed.  Cardiovascular:     Rate and Rhythm: Normal rate and regular rhythm.  Pulmonary:     Breath sounds: No decreased breath sounds, wheezing or rhonchi.  Chest:     Chest wall: No tenderness.     Comments: Does have a small  nodule in the xiphoid area.  Also a small nodule in the soft tissue on the left mid abdomen.  Also 1 on right thigh.  All have been there previously and stable over the last couple years. Neurological:     Mental Status: He is alert.     ED Results / Procedures / Treatments   Labs (all labs ordered are listed, but only abnormal results are displayed) Labs Reviewed  BASIC METABOLIC PANEL - Abnormal; Notable for the following components:      Result Value   Glucose, Bld 160 (*)    Calcium 8.5 (*)    All other components within normal limits  TROPONIN I (HIGH SENSITIVITY)    EKG EKG Interpretation  Date/Time:  Monday October 22 2022 10:10:45 EDT Ventricular Rate:  69 PR Interval:  162 QRS Duration: 73 QT Interval:  361 QTC Calculation: 387 R Axis:   61 Text Interpretation: Sinus rhythm Confirmed by Benjiman Core [phone]) on 10/22/2022 10:19:14 AM  Radiology DG Chest 2 View  Result Date: 10/22/2022 CLINICAL DATA:  Chest pain EXAM: CHEST - 2 VIEW COMPARISON:  None Available. FINDINGS: Normal mediastinum and cardiac silhouette. Normal pulmonary vasculature. No evidence of effusion, infiltrate, or pneumothorax. No acute bony  abnormality. IMPRESSION: No acute cardiopulmonary process. Electronically Signed   By: Genevive Bi M.D.   On: 10/22/2022 11:36    Procedures Procedures    Medications Ordered in ED Medications - No data to display  ED Course/ Medical Decision Making/ A&P                             Medical Decision Making Amount and/or Complexity of Data Reviewed Labs: ordered. Radiology: ordered.   Patient with chest pain.  Anterior chest.  Some shortness of breath.  No real cough.  X-ray reassuring.  EKG reassuring.  Doubt cardiac ischemia.  Troponin negative.  Lungs clear.  Has had some shortness of breath potential URI type symptoms.  Appears stable for discharge home however.  Doubt cardiac ischemia.  Discharge home.        Final Clinical  Impression(s) / ED Diagnoses Final diagnoses:  Nonspecific chest pain    Rx / DC Orders ED Discharge Orders     None         Benjiman Core, MD 10/22/22 1209

## 2022-12-17 ENCOUNTER — Emergency Department (HOSPITAL_COMMUNITY)
Admission: EM | Admit: 2022-12-17 | Discharge: 2022-12-17 | Disposition: A | Discharge status: Home / Self Care | Payer: Self-pay | Attending: Emergency Medicine | Admitting: Emergency Medicine

## 2022-12-17 DIAGNOSIS — K645 Perianal venous thrombosis: Secondary | ICD-10-CM | POA: Insufficient documentation

## 2022-12-17 MED ORDER — DOCUSATE SODIUM 100 MG PO CAPS: 100.0000 mg | ORAL_CAPSULE | Freq: Two times a day (BID) | ORAL | 0 refills | Status: AC

## 2022-12-17 MED ORDER — OXYCODONE-ACETAMINOPHEN 5-325 MG PO TABS: 1.0000 | ORAL_TABLET | Freq: Four times a day (QID) | ORAL | 0 refills | Status: AC | PRN

## 2022-12-17 NOTE — ED Provider Notes (Signed)
[name] EMERGENCY DEPARTMENT AT [facility] Provider Note   CSN: [id number] Arrival date & time: 12/17/22  1229     History  Chief Complaint  Patient presents with   Hemorrhoids    Charles Rollins is a 37 y.o. male, pertinent past medical history, who presents to the ED secondary to swelling tenderness to his anus that occurred 1 day ago.  He denies any trauma to the anus, but states that all of a sudden the area will.  He denies any kind of difficulty with stools, but states he has had hemorrhoids before.  He typically treats with Anucort, but she states this time it has not helped.    Home Medications Prior to Admission medications   Medication Sig Start Date End Date Taking? Authorizing Provider  docusate sodium (COLACE) 100 MG capsule Take 1 capsule (100 mg total) by mouth every 12 (twelve) hours. 12/17/22  Yes Danyal Adorno L, PA  oxyCODONE-acetaminophen (PERCOCET/ROXICET) 5-325 MG tablet Take 1 tablet by mouth every 6 (six) hours as needed for severe pain. 12/17/22  Yes Elise Gladden L, PA  benzonatate (TESSALON) 100 MG capsule Take 1 capsule (100 mg total) by mouth every 8 (eight) hours. 02/08/19   Harlene Salts A, PA-C  hydrOXYzine (ATARAX/VISTARIL) 25 MG tablet Take 1 tablet (25 mg total) by mouth every 8 (eight) hours as needed for anxiety or nausea. 03/13/21   Petrucelli, Pleas Koch, PA-C  predniSONE (DELTASONE) 20 MG tablet Take 2 tablets (40 mg total) by mouth daily with breakfast. For the next four days 06/06/22   Gerhard Munch, MD      Allergies    Patient has no known allergies.    Review of Systems   Review of Systems  Constitutional:  Negative for fever.  Genitourinary:        +anal pain    Physical Exam Updated Vital Signs BP 122/88 (BP Location: Left Arm)   Pulse 76   Temp 98.1 F (36.7 C) (Oral)   Resp 18   Ht 6' (1.829 m)   Wt 81.6 kg   SpO2 98%   BMI 24.41 kg/m  Physical Exam Vitals and nursing note reviewed.   Constitutional:      General: He is not in acute distress.    Appearance: He is well-developed.  HENT:     Head: Normocephalic and atraumatic.  Eyes:     General:        Right eye: No discharge.        Left eye: No discharge.     Conjunctiva/sclera: Conjunctivae normal.  Pulmonary:     Effort: No respiratory distress.  Genitourinary:    Comments: Large hemorrhoid as pictured Neurological:     Mental Status: He is alert.     Comments: Clear speech.   Psychiatric:        Behavior: Behavior normal.        Thought Content: Thought content normal.     ED Results / Procedures / Treatments   Labs (all labs ordered are listed, but only abnormal results are displayed) Labs Reviewed - No data to display  EKG None  Radiology No results found.  Procedures Procedures    Medications Ordered in ED Medications - No data to display  ED Course/ Medical Decision Making/ A&P                             Medical Decision Making Patient is a  37 year old male, here for hemorrhoid problems, for the last day.  He states that it is extremely tender, to the touch, and denies any kind of difficulty with stools.  Has tried soaks, Anucort without any relief.  I discussed with Dr. Estell Harpin, who evaluated patient, believes this is likely thrombosed, but patient will not tolerate, drainage, thus we will refer to surgery and prescribed Percocet.  I prescribed him Dulcolax to help with softening stool to prevent further anal trauma.  Discussed follow-up with surgery, provided information.  Risk OTC drugs. Prescription drug management.    Final Clinical Impression(s) / ED Diagnoses Final diagnoses:  Hemorrhoids, thrombosed    Rx / DC Orders ED Discharge Orders          Ordered    oxyCODONE-acetaminophen (PERCOCET/ROXICET) 5-325 MG tablet  Every 6 hours PRN        12/17/22 1340    docusate sodium (COLACE) 100 MG capsule  Every 12 hours        12/17/22 1340              Kameo Bains,  [place], [place] 12/17/22 1343    Bethann Berkshire, MD 12/18/22 1141

## 2022-12-17 NOTE — Discharge Instructions (Addendum)
Please follow-up with the general surgery as instructed.  You can use sitz bath, anticoag, and the narcotic medication to help with your pain.  You can also make sure you are laying on your side, to prevent some pressure.  Also use some stool softener such as Dulcolax, or MiraLAX, to help with your bowel movements, to help with the trauma associated with having a bowel movement.

## 2022-12-17 NOTE — ED Triage Notes (Signed)
Pt reports hemorrhoid starting Sunday night, pain getting worse since then. Tried soaks, creams, no otc meds. No bleeding.

## 2023-09-02 ENCOUNTER — Emergency Department (HOSPITAL_COMMUNITY)
Admission: EM | Admit: 2023-09-02 | Discharge: 2023-09-02 | Disposition: A | Discharge status: Home / Self Care | Payer: Self-pay | Attending: Emergency Medicine | Admitting: Emergency Medicine

## 2023-09-02 ENCOUNTER — Other Ambulatory Visit: Payer: Self-pay

## 2023-09-02 DIAGNOSIS — R112 Nausea with vomiting, unspecified: Secondary | ICD-10-CM | POA: Insufficient documentation

## 2023-09-02 DIAGNOSIS — R519 Headache, unspecified: Secondary | ICD-10-CM | POA: Insufficient documentation

## 2023-09-02 DIAGNOSIS — J45909 Unspecified asthma, uncomplicated: Secondary | ICD-10-CM | POA: Insufficient documentation

## 2023-09-02 DIAGNOSIS — R197 Diarrhea, unspecified: Secondary | ICD-10-CM | POA: Insufficient documentation

## 2023-09-02 DIAGNOSIS — R1010 Upper abdominal pain, unspecified: Secondary | ICD-10-CM | POA: Insufficient documentation

## 2023-09-02 DIAGNOSIS — R1030 Lower abdominal pain, unspecified: Secondary | ICD-10-CM | POA: Insufficient documentation

## 2023-09-02 LAB — COMPREHENSIVE METABOLIC PANEL
ALT: 22 U/L (ref 0–44)
AST: 26 U/L (ref 15–41)
Albumin: 4.2 g/dL (ref 3.5–5.0)
Alkaline Phosphatase: 66 U/L (ref 38–126)
Anion gap: 7 (ref 5–15)
BUN: 14 mg/dL (ref 6–20)
CO2: 24 mmol/L (ref 22–32)
Calcium: 9.4 mg/dL (ref 8.9–10.3)
Chloride: 106 mmol/L (ref 98–111)
Creatinine, Ser: 0.98 mg/dL (ref 0.61–1.24)
GFR, Estimated: >60 mL/min (ref >60)
Glucose, Bld: 110 mg/dL — ABNORMAL HIGH (ref 70–99)
Potassium: 3.4 mmol/L — ABNORMAL LOW (ref 3.5–5.1)
Sodium: 137 mmol/L (ref 135–145)
Total Bilirubin: 1 mg/dL (ref 0.0–1.2)
Total Protein: 7.9 g/dL (ref 6.5–8.1)

## 2023-09-02 LAB — URINALYSIS, ROUTINE W REFLEX MICROSCOPIC
Bilirubin Urine: NEGATIVE
Glucose, UA: NEGATIVE mg/dL
Hgb urine dipstick: NEGATIVE
Ketones, ur: NEGATIVE mg/dL
Leukocytes,Ua: NEGATIVE
Nitrite: NEGATIVE
Protein, ur: NEGATIVE mg/dL
Specific Gravity, Urine: 1.031 — ABNORMAL HIGH (ref 1.005–1.030)
pH: 5 (ref 5.0–8.0)

## 2023-09-02 LAB — CBC
HCT: 49.6 % (ref 39.0–52.0)
Hemoglobin: 16.3 g/dL (ref 13.0–17.0)
MCH: 31.2 pg (ref 26.0–34.0)
MCHC: 32.9 g/dL (ref 30.0–36.0)
MCV: 95 fL (ref 80.0–100.0)
Platelets: 231 10*3/uL (ref 150–400)
RBC: 5.22 MIL/uL (ref 4.22–5.81)
RDW: 13.1 % (ref 11.5–15.5)
WBC: 7.2 10*3/uL (ref 4.0–10.5)
nRBC: 0 % (ref 0.0–0.2)

## 2023-09-02 LAB — LIPASE, BLOOD: Lipase: 30 U/L (ref 11–51)

## 2023-09-02 MED ORDER — IBUPROFEN 200 MG PO TABS
400.0000 mg | ORAL_TABLET | Freq: Once | ORAL | Status: AC
  Administered 2023-09-02: 400 mg via ORAL
  Filled 2023-09-02: qty 2

## 2023-09-02 MED ORDER — ALBUTEROL SULFATE HFA 108 (90 BASE) MCG/ACT IN AERS
2.0000 | INHALATION_SPRAY | Freq: Once | RESPIRATORY_TRACT | Status: AC
  Administered 2023-09-02: 2 via RESPIRATORY_TRACT
  Filled 2023-09-02: qty 6.7

## 2023-09-02 MED ORDER — ACETAMINOPHEN 325 MG PO TABS
650.0000 mg | ORAL_TABLET | Freq: Once | ORAL | Status: AC
  Administered 2023-09-02: 650 mg via ORAL
  Filled 2023-09-02: qty 2

## 2023-09-02 MED ORDER — ONDANSETRON 4 MG PO TBDP: 4.0000 mg | ORAL_TABLET | Freq: Three times a day (TID) | ORAL | 0 refills | Status: AC | PRN

## 2023-09-02 NOTE — ED Triage Notes (Signed)
 Pt states that he has had a headache with nausea since Friday. Pt denies any head injury and no vomiting. Pt is able to eat small meals without difficulty. Pt states that he has taken tylenol for the headaches without relief. Pt is alert and oriented x4.

## 2023-09-02 NOTE — Discharge Instructions (Addendum)
 You were seen today for headache, nausea, vomiting, diarrhea.  Your headache having been improved with ibuprofen and Tylenol is reassuring as well as with other symptoms improving.  Your physical exam and labs were also very reassuring that I have low suspicion for any other emergent process happening at this time.  Recommend they continue to hydrate, sleep well, eat as tolerated.  I provided nausea medication for you to go home with if needed.  Recommend he continue to take Tylenol ibuprofen for headache relief.  Take Tylenol (acetominophen)  650mg  every 4-6 hours, as needed for pain or fever. Do not take more than 4,000 mg in a 24-hour period. As this may cause liver damage. While this is rare, if you begin to develop yellowing of the skin or eyes, stop taking and return to ER immediately.  Take Ibuprofen 400mg  every 4-6 hours for pain or fever, not exceeding 3,200 mg per day as more than 3,200mg  can cause Stomach irritation, dizziness, kidney issues with long-term use.  Return for any new or worsening symptoms.  Recommend that you follow-up with your PCP which I have attached to your after visit summary so that you can have your asthma reevaluated.

## 2023-09-02 NOTE — ED Provider Notes (Signed)
  EMERGENCY DEPARTMENT AT [facility] Provider Note   CSN: [id number] Arrival date & time: 09/02/23  7829     History  Chief Complaint  Patient presents with   Headache   Nausea    Charles Rollins is a 38 y.o. male.   Headache Patient is a 38 year old male presents the ED today complaining of headache that has been present with some nausea, vomiting, diarrhea x 3 days.  Previous medical history of asthma.  States that symptoms have been increasingly improved however has not been hydrating appropriately despite being able to tolerate p.o. fluids and food.  Also reports not sleeping well.  Came in because he was concern for mild left-sided abdominal pain.  Denies fever, vision changes, vertigo, hearing loss, sore throat, chest pain, shortness of breath, dysuria, melena, hematochezia, lower leg swelling.     Home Medications Prior to Admission medications   Medication Sig Start Date End Date Taking? Authorizing Provider  ondansetron (ZOFRAN-ODT) 4 MG disintegrating tablet Take 1 tablet (4 mg total) by mouth every 8 (eight) hours as needed for nausea or vomiting. 09/02/23  Yes Lunette Stands, PA-C  benzonatate (TESSALON) 100 MG capsule Take 1 capsule (100 mg total) by mouth every 8 (eight) hours. 02/08/19   Harlene Salts A, PA-C  docusate sodium (COLACE) 100 MG capsule Take 1 capsule (100 mg total) by mouth every 12 (twelve) hours. 12/17/22   Small, Brooke L, PA  hydrOXYzine (ATARAX/VISTARIL) 25 MG tablet Take 1 tablet (25 mg total) by mouth every 8 (eight) hours as needed for anxiety or nausea. 03/13/21   Petrucelli, Pleas Koch, PA-C  oxyCODONE-acetaminophen (PERCOCET/ROXICET) 5-325 MG tablet Take 1 tablet by mouth every 6 (six) hours as needed for severe pain. 12/17/22   Small, Brooke L, PA  predniSONE (DELTASONE) 20 MG tablet Take 2 tablets (40 mg total) by mouth daily with breakfast. For the next four days 06/06/22   Gerhard Munch, MD      Allergies     Patient has no known allergies.    Review of Systems   Review of Systems  Neurological:  Positive for headaches.  All other systems reviewed and are negative.   Physical Exam Updated Vital Signs BP 123/81   Pulse 72   Temp 97.8 F (36.6 C) (Oral)   Resp 16   Ht 6' (1.829 m)   Wt 83.9 kg   SpO2 100%   BMI 25.09 kg/m  Physical Exam Vitals and nursing note reviewed.  Constitutional:      General: He is not in acute distress.    Appearance: Normal appearance. He is not ill-appearing.  HENT:     Head: Normocephalic and atraumatic.  Eyes:     General: No scleral icterus.    Extraocular Movements: Extraocular movements intact.     Right eye: Normal extraocular motion and no nystagmus.     Left eye: Normal extraocular motion and no nystagmus.     Conjunctiva/sclera: Conjunctivae normal.     Pupils: Pupils are equal.     Right eye: Pupil is round and reactive.     Left eye: Pupil is round and reactive.  Cardiovascular:     Rate and Rhythm: Normal rate and regular rhythm.     Pulses: Normal pulses.     Heart sounds: Normal heart sounds. No murmur heard.    No friction rub. No gallop.  Pulmonary:     Effort: Pulmonary effort is normal. No respiratory distress.  Breath sounds: No stridor. Wheezing (Bilateral wheezes noted.) present. No rhonchi or rales.  Abdominal:     General: Abdomen is flat. There is no distension.     Palpations: Abdomen is soft.     Tenderness: There is abdominal tenderness (Mild left-sided abdominal tenderness noted to palpation.  In both upper and lower quadrants). There is no right CVA tenderness, left CVA tenderness or guarding.  Musculoskeletal:     Cervical back: Normal range of motion. No rigidity.     Right lower leg: No edema.     Left lower leg: No edema.  Lymphadenopathy:     Cervical: No cervical adenopathy.  Skin:    General: Skin is warm and dry.     Coloration: Skin is not cyanotic or pale.     Findings: No bruising, erythema or  rash.  Neurological:     General: No focal deficit present.     Mental Status: He is alert and oriented to person, place, and time. Mental status is at baseline.     Sensory: No sensory deficit.     Motor: No weakness.  Psychiatric:        Mood and Affect: Mood normal.     ED Results / Procedures / Treatments   Labs (all labs ordered are listed, but only abnormal results are displayed) Labs Reviewed  COMPREHENSIVE METABOLIC PANEL - Abnormal; Notable for the following components:      Result Value   Potassium 3.4 (*)    Glucose, Bld 110 (*)    All other components within normal limits  URINALYSIS, ROUTINE W REFLEX MICROSCOPIC - Abnormal; Notable for the following components:   Specific Gravity, Urine 1.031 (*)    All other components within normal limits  LIPASE, BLOOD  CBC    EKG None  Radiology No results found.  Procedures Procedures    Medications Ordered in ED Medications  ibuprofen (ADVIL) tablet 400 mg (has no administration in time range)  acetaminophen (TYLENOL) tablet 650 mg (has no administration in time range)  albuterol (VENTOLIN HFA) 108 (90 Base) MCG/ACT inhaler 2 puff (2 puffs Inhalation Given 09/02/23 1137)    ED Course/ Medical Decision Making/ A&P                                 Medical Decision Making Amount and/or Complexity of Data Reviewed Labs: ordered.   Patient is a 38 year old male presents the ED today complaining of headache that has been present with some nausea, vomiting, diarrhea x 3 days.  Previous medical history of asthma.  States that symptoms have been increasingly improved however has not been hydrating appropriately despite being able to tolerate p.o. fluids and food.  Also reports not sleeping well.  Came in because he was concern for mild left-sided abdominal pain.  Reports that this happened after he ate dinner on Friday.  On physical exam, patient is noted to be afebrile, no acute distress, speaking in full sentences,  alert oriented x 4.  Noted to have mild wheezes bilaterally, not tachypneic.  Also noted to have some mild tenderness to palpation with no guarding or rebound tenderness to the left side of his abdomen.  Exam is otherwise unremarkable.   Due to symptoms improving over the course the last few days, believe that headache is most likely due to dehydration and sleep deprivation accompanied with strain from vomiting.  Low suspicion for any other emergent pathology as  he says that this is alleviated with ibuprofen but came in today because he was concerned that he was taking too many pills.  States that he has not felt short of breath although and has run out of his albuterol inhaler.  On reevaluation, wheezing has notably improved as well as headache improved.  Recommend that he continue to hydrate, sleep, take ibuprofen as instructed with Tylenol.  Low suspicion for any other emergent pathology present this time.  Patient expressed agreement understanding of plan.  Provided strict return to ED precautions.  Patient vital signs remained stable at the course of his time here.  I believe patient is to be discharged at this time.  Differential diagnoses prior to evaluation: Diverticulitis, gastritis, enteritis, pancreatitis, UTI, appendicitis, foodborne illness,  Past Medical History / Social History / Additional history: Chart reviewed. Pertinent results include: Asthma  Medications / Treatment: Provided albuterol for wheezing with notable improvement.  Will also send prescription of Zofran to pharmacy.  Provided Tylenol and ibuprofen for headache with improvement.   Disposition: After consideration of the diagnostic results and the patients response to treatment, I feel that the patient benefit from discharge treatment as above.   emergency department workup does not suggest an emergent condition requiring admission or immediate intervention beyond what has been performed at this time. The plan is:  Symptomatic management at home, return for any new or worsening symptoms. The patient is safe for discharge and has been instructed to return immediately for worsening symptoms, change in symptoms or any other concerns.   Final Clinical Impression(s) / ED Diagnoses Final diagnoses:  Acute nonintractable headache, unspecified headache type  Nausea vomiting and diarrhea    Rx / DC Orders ED Discharge Orders          Ordered    ondansetron (ZOFRAN-ODT) 4 MG disintegrating tablet  Every 8 hours PRN        09/02/23 1138              Lunette Stands, [place] 09/02/23 1150    Wynetta Fines, MD 09/02/23 [phone]

## 2024-02-25 ENCOUNTER — Emergency Department (HOSPITAL_COMMUNITY): Payer: Self-pay

## 2024-02-25 ENCOUNTER — Emergency Department (HOSPITAL_COMMUNITY)
Admission: EM | Admit: 2024-02-25 | Discharge: 2024-02-25 | Disposition: A | Discharge status: Home / Self Care | Payer: Self-pay

## 2024-02-25 ENCOUNTER — Encounter (HOSPITAL_COMMUNITY): Payer: Self-pay | Admitting: Emergency Medicine

## 2024-02-25 DIAGNOSIS — R059 Cough, unspecified: Secondary | ICD-10-CM | POA: Insufficient documentation

## 2024-02-25 DIAGNOSIS — J4521 Mild intermittent asthma with (acute) exacerbation: Secondary | ICD-10-CM | POA: Diagnosis not present

## 2024-02-25 DIAGNOSIS — Z7951 Long term (current) use of inhaled steroids: Secondary | ICD-10-CM | POA: Diagnosis not present

## 2024-02-25 DIAGNOSIS — M545 Low back pain, unspecified: Secondary | ICD-10-CM | POA: Diagnosis present

## 2024-02-25 DIAGNOSIS — R062 Wheezing: Secondary | ICD-10-CM

## 2024-02-25 LAB — BASIC METABOLIC PANEL WITH GFR
Anion gap: 13 (ref 5–15)
BUN: 13 mg/dL (ref 6–20)
CO2: 23 mmol/L (ref 22–32)
Calcium: 9.7 mg/dL (ref 8.9–10.3)
Chloride: 105 mmol/L (ref 98–111)
Creatinine, Ser: 1.2 mg/dL (ref 0.61–1.24)
GFR, Estimated: >60 mL/min (ref >60)
Glucose, Bld: 89 mg/dL (ref 70–99)
Potassium: 3.7 mmol/L (ref 3.5–5.1)
Sodium: 140 mmol/L (ref 135–145)

## 2024-02-25 LAB — URINALYSIS, ROUTINE W REFLEX MICROSCOPIC
Bilirubin Urine: NEGATIVE
Glucose, UA: NEGATIVE mg/dL
Hgb urine dipstick: NEGATIVE
Ketones, ur: NEGATIVE mg/dL
Leukocytes,Ua: NEGATIVE
Nitrite: NEGATIVE
Protein, ur: NEGATIVE mg/dL
Specific Gravity, Urine: 1.03 (ref 1.005–1.030)
pH: 5 (ref 5.0–8.0)

## 2024-02-25 LAB — CBC WITH DIFFERENTIAL/PLATELET
Abs Immature Granulocytes: 0.01 K/uL (ref 0.00–0.07)
Basophils Absolute: 0.1 K/uL (ref 0.0–0.1)
Basophils Relative: 1 %
Eosinophils Absolute: 0.2 K/uL (ref 0.0–0.5)
Eosinophils Relative: 3 %
HCT: 49.1 % (ref 39.0–52.0)
Hemoglobin: 16.4 g/dL (ref 13.0–17.0)
Immature Granulocytes: 0 %
Lymphocytes Relative: 52 %
Lymphs Abs: 3.6 K/uL (ref 0.7–4.0)
MCH: 30.8 pg (ref 26.0–34.0)
MCHC: 33.4 g/dL (ref 30.0–36.0)
MCV: 92.3 fL (ref 80.0–100.0)
Monocytes Absolute: 0.7 K/uL (ref 0.1–1.0)
Monocytes Relative: 10 %
Neutro Abs: 2.3 K/uL (ref 1.7–7.7)
Neutrophils Relative %: 34 %
Platelets: 243 K/uL (ref 150–400)
RBC: 5.32 MIL/uL (ref 4.22–5.81)
RDW: 12.8 % (ref 11.5–15.5)
WBC: 6.8 K/uL (ref 4.0–10.5)
nRBC: 0 % (ref 0.0–0.2)

## 2024-02-25 LAB — RESP PANEL BY RT-PCR (RSV, FLU A&B, COVID)  RVPGX2
Influenza A by PCR: NEGATIVE
Influenza B by PCR: NEGATIVE
Resp Syncytial Virus by PCR: NEGATIVE
SARS Coronavirus 2 by RT PCR: NEGATIVE

## 2024-02-25 LAB — CBG MONITORING, ED: Glucose-Capillary: 103 mg/dL — ABNORMAL HIGH (ref 70–99)

## 2024-02-25 MED ORDER — DEXAMETHASONE SODIUM PHOSPHATE 10 MG/ML IJ SOLN
10.0000 mg | Freq: Once | INTRAMUSCULAR | Status: AC
  Administered 2024-02-25: 10 mg via INTRAMUSCULAR
  Filled 2024-02-25: qty 1

## 2024-02-25 MED ORDER — IPRATROPIUM-ALBUTEROL 0.5-2.5 (3) MG/3ML IN SOLN
3.0000 mL | Freq: Once | RESPIRATORY_TRACT | Status: AC
  Administered 2024-02-25: 3 mL via RESPIRATORY_TRACT
  Filled 2024-02-25: qty 3

## 2024-02-25 NOTE — ED Provider Notes (Signed)
 [place] EMERGENCY DEPARTMENT AT [facility] Provider Note   CSN: [id number] Arrival date & time: 02/25/24  1654     Patient presents with: Back Pain   Charles Rollins is a 38 y.o. Rollins.   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  Additionally he is also concerned for his kidneys due to his grandma saying that diabetes runs in the family and wishes to have his kidney function evaluated.  Previous medical history of asthma  Also reporting cough for the last week that has been ongoing, requiring use of his inhaler.  Denies IVD use, unexplained weight loss, saddle paresthesias, fecal/urinary incontinence.  Denies fever, body aches, chills, headache, vision changes, chest pain, shortness of breath, abdominal pain, nausea, vomiting, diarrhea, dysuria, rashes, lower leg swelling.    Prior to Admission medications   Medication Sig Start Date End Date Taking? Authorizing Provider  benzonatate  (TESSALON ) 100 MG capsule Take 1 capsule (100 mg total) by mouth every 8 (eight) hours. 02/08/19   Donah Riis A, PA-C  docusate sodium  (COLACE) 100 MG capsule Take 1 capsule (100 mg total) by mouth every 12 (twelve) hours. 12/17/22   Small, Brooke L, PA  hydrOXYzine  (ATARAX /VISTARIL ) 25 MG tablet Take 1 tablet (25 mg total) by mouth every 8 (eight) hours as needed for anxiety or nausea. 03/13/21   Petrucelli, Samantha R, PA-C  ondansetron  (ZOFRAN -ODT) 4 MG disintegrating tablet Take 1 tablet (4 mg total) by mouth every 8 (eight) hours as needed for nausea or vomiting. 09/02/23   Willetta York S, PA-C  oxyCODONE -acetaminophen  (PERCOCET/ROXICET) 5-325 MG tablet Take 1 tablet by mouth every 6 (six) hours as needed for severe pain. 12/17/22   Small,  Brooke L, PA  predniSONE  (DELTASONE ) 20 MG tablet Take 2 tablets (40 mg total) by mouth daily with breakfast. For the next four days 06/06/22   Garrick Charleston, MD    Allergies: Patient has no known allergies.    Review of Systems  Respiratory:  Positive for cough.   Musculoskeletal:  Positive for back pain.  All other systems reviewed and are negative.   Updated Vital Signs BP (!) 135/90 (BP Location: Left Arm)   Pulse 86   Temp 98.3 F (36.8 C) (Oral)   Resp 16   SpO2 97%   Physical Exam Vitals and nursing note reviewed.  Constitutional:      General: He is not in acute distress.    Appearance: Normal appearance. He is not ill-appearing or diaphoretic.  HENT:     Head: Normocephalic and atraumatic.     Mouth/Throat:     Mouth: Mucous membranes are moist.     Pharynx: Oropharynx is clear. No oropharyngeal exudate or posterior oropharyngeal erythema.  Eyes:     General: No scleral icterus.       Right eye: No discharge.        Left eye: No discharge.     Extraocular Movements: Extraocular movements intact.     Conjunctiva/sclera: Conjunctivae normal.  Cardiovascular:     Rate and Rhythm: Normal rate and regular rhythm.     Pulses: Normal pulses.     Heart sounds: Normal heart sounds. No murmur heard.    No friction rub. No gallop.  Pulmonary:     Effort: Pulmonary effort is normal. No respiratory distress.     Breath sounds: No stridor. Wheezing (Wheezes noted at bilateral lower lobes.) present. No rhonchi or rales.  Chest:     Chest wall: No tenderness.  Abdominal:     General: Abdomen is flat. There is no distension.     Palpations: Abdomen is soft.     Tenderness: There is no abdominal tenderness. There is no right CVA tenderness, left CVA tenderness, guarding or rebound.  Musculoskeletal:        General: Tenderness (Noted to have mild paraspinal muscle tenderness noted to lumbar spine over spinal erectors to right side.) present. No swelling, deformity or signs  of injury.     Cervical back: Normal range of motion. No rigidity.     Right lower leg: No edema.     Left lower leg: No edema.  Skin:    General: Skin is warm and dry.     Findings: No bruising, erythema or lesion.  Neurological:     General: No focal deficit present.     Mental Status: He is alert and oriented to person, place, and time. Mental status is at baseline.     Sensory: No sensory deficit.     Motor: No weakness.  Psychiatric:        Mood and Affect: Mood normal.     (all labs ordered are listed, but only abnormal results are displayed) Labs Reviewed  CBG MONITORING, ED - Abnormal; Notable for the following components:      Result Value   Glucose-Capillary 103 (*)    All other components within normal limits  RESP PANEL BY RT-PCR (RSV, FLU A&B, COVID)  RVPGX2  URINALYSIS, ROUTINE W REFLEX MICROSCOPIC  BASIC METABOLIC PANEL WITH GFR  CBC WITH DIFFERENTIAL/PLATELET    EKG: None  Radiology: DG Chest 2 View Result Date: 02/25/2024 CLINICAL DATA:  Bilateral wheezing, shortness of breath EXAM: CHEST - 2 VIEW COMPARISON:  Chest radiograph February 21, 2023 FINDINGS: The heart size and mediastinal contours are within normal limits. Mild bronchial wall thickening. Both lungs are clear. The visualized skeletal structures are unremarkable. IMPRESSION: Mild bronchial wall thickening suggestive medium and large size airway disease. Otherwise no acute pulmonary finding there Electronically Signed   By: Megan  Zare M.D.   On: 02/25/2024 19:14    Procedures   Medications Ordered in the ED  ipratropium-albuterol  (DUONEB) 0.5-2.5 (3) MG/3ML nebulizer solution 3 mL (3 mLs Nebulization Given 02/25/24 1823)  dexamethasone  (DECADRON ) injection 10 mg (10 mg Intramuscular Given 02/25/24 1925)                                Medical Decision Making Amount and/or Complexity of Data Reviewed Labs: ordered. Radiology: ordered.  Risk Prescription drug management.   This patient is a  38 year old Rollins who presents to the ED for concern of multiple complaints.  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  Noticed to feel like he feels sore to his right low back.  Also noted that he has felt like he has been wheezing more often.  On physical exam, patient is in no acute distress, afebrile, alert and orient x 4, speaking in full sentences, nontachypneic, nontachycardic.Noted with some mild paraspinal muscle  tenderness to the right side over spinal erector muscles.  Notably also has bilateral wheezes present at lower lobes.  Exam is otherwise unremarkable.  Provided DuoNebs and dexamethasone .  With him not complaining of any other infectious symptoms, low suspicion for pneumonia, believe this is likely secondary to possible asthma exacerbation versus bronchitis, provided dexamethasone  today with patient currently not having his asthma managed by anyone at this time and with having noted wheezing which was accompanied by some shortness of breath earlier.  On reevaluation, wheezing had abated, with symptoms have greatly improved, patient is feeling significantly better.  All other labs were unremarkable.  Will have him continue to follow-up with PCP to establish care and have further management for his asthma.  Patient vital signs have remained stable throughout the course of patient's time in the ED. Low suspicion for any other emergent pathology at this time. I believe this patient is safe to be discharged. Provided strict return to ER precautions. Patient expressed agreement and understanding of plan. All questions were answered.   Differential diagnoses prior to evaluation: The emergent differential diagnosis includes, but is not limited to,  Fracture (acute/chronic), muscle strain, cauda equina, spinal stenosis, DDD, metastatic  cancer, vertebral osteomyelitis, kidney stone, pyelonephritis, AAA, pancreatitis, bowel obstruction, meningitis.  . This is not an exhaustive differential.   Past Medical History / Co-morbidities / Social History: Asthma  Additional history: Chart reviewed. Pertinent results include:   Last seen in the ED on 08/31/2023.  Noted to have nausea, vomiting, diarrhea with headache.  Lab Tests/Imaging studies: I personally interpreted labs/imaging and the pertinent results include:   CBC unremarkable BMP unremarkable UA unremarkable Respiratory swabs unremarkable Chest x-ray shows mild bronchial wall thickening .   I agree with the radiologist interpretation.    Medications: I ordered medication including dexamethasone , DuoNebs.  I have reviewed the patients home medicines and have made adjustments as needed.  Critical Interventions: None  Social Determinants of Health: Does not currently have a PCP at this time.  Disposition: After consideration of the diagnostic results and the patients response to treatment, I feel that the patient would benefit from discharge and treatment as above.   emergency department workup does not suggest an emergent condition requiring admission or immediate intervention beyond what has been performed at this time. The plan is: Establish care with PCP, continue to monitor symptoms and return to the ED for new or worsening symptoms. The patient is safe for discharge and has been instructed to return immediately for worsening symptoms, change in symptoms or any other concerns.  Final diagnoses:  Wheezing  Acute right-sided low back pain without sciatica  Mild intermittent asthma with exacerbation    ED Discharge Orders     None          Beola Terrall RAMAN, PA-C 02/25/24 1939    Neysa Caron PARAS, DO 02/26/24 [phone]

## 2024-02-25 NOTE — ED Triage Notes (Signed)
 Pt arriving with concern for lower back pain and chest congestion. Pt reports he does have asthma. Pt also concerned that he is thirsty all the time, states he has a family history of diabetes and is concerned he may be diabetic. Pt is concerned that his lower back pain may be an issue with his kidneys.

## 2024-02-25 NOTE — Discharge Instructions (Signed)
 You are seen today for back pain as well as noted to be wheezing today.  Your lab work, imaging were very reassuring today and on reevaluation after you were provided a breathing treatment, your wheezing had significantly improved.  However would still recommend that you continue to follow-up with a PCP, I have provided 1 above for you to call their office and schedule an appointment for an ER follow-up.  However you can call any PCP that you would prefer.  This will be the best course of action to help manage your asthma.  In the meantime, continue to manage with Tylenol  and ibuprofen  as pain relief for your low back pain. Take Tylenol  (acetominophen)  650mg  every 4-6 hours, as needed for pain or fever. Do not take more than 4,000 mg in a 24-hour period. As this may cause liver damage. While this is rare, if you begin to develop yellowing of the skin or eyes, stop taking and return to ER immediately.  Take Ibuprofen  400mg  every 4-6 hours for pain or fever, not exceeding 3,200 mg per day as more than 3,200mg  can cause Stomach irritation, dizziness, kidney issues with long-term use.  You are provided a steroid today and that has similar effect as to taking steroids orally for the next 5 days.  So you should still see improvement of symptoms for your wheezing.
